# Patient Record
Sex: Female | Born: 1955 | Race: White | Hispanic: No | Marital: Single | Smoking: Never smoker
Health system: Southern US, Community
[De-identification: ages and names within clinical notes are randomized; demographics above are authoritative.]

## PROBLEM LIST (undated history)

## (undated) DIAGNOSIS — E669 Obesity, unspecified: Secondary | ICD-10-CM

## (undated) DIAGNOSIS — E039 Hypothyroidism, unspecified: Secondary | ICD-10-CM

## (undated) DIAGNOSIS — M1990 Unspecified osteoarthritis, unspecified site: Secondary | ICD-10-CM

## (undated) DIAGNOSIS — T8859XA Other complications of anesthesia, initial encounter: Secondary | ICD-10-CM

## (undated) DIAGNOSIS — M797 Fibromyalgia: Secondary | ICD-10-CM

## (undated) DIAGNOSIS — F3181 Bipolar II disorder: Secondary | ICD-10-CM

## (undated) DIAGNOSIS — G43909 Migraine, unspecified, not intractable, without status migrainosus: Secondary | ICD-10-CM

## (undated) DIAGNOSIS — J45909 Unspecified asthma, uncomplicated: Secondary | ICD-10-CM

## (undated) DIAGNOSIS — E079 Disorder of thyroid, unspecified: Secondary | ICD-10-CM

## (undated) DIAGNOSIS — I10 Essential (primary) hypertension: Secondary | ICD-10-CM

## (undated) DIAGNOSIS — S0300XA Dislocation of jaw, unspecified side, initial encounter: Secondary | ICD-10-CM

## (undated) DIAGNOSIS — T7840XA Allergy, unspecified, initial encounter: Secondary | ICD-10-CM

## (undated) DIAGNOSIS — R7303 Prediabetes: Secondary | ICD-10-CM

## (undated) HISTORY — DX: Prediabetes: R73.03

## (undated) HISTORY — PX: NOSE SURGERY: SHX723

## (undated) HISTORY — DX: Unspecified asthma, uncomplicated: J45.909

## (undated) HISTORY — DX: Disorder of thyroid, unspecified: E07.9

## (undated) HISTORY — PX: BUNIONECTOMY: SHX129

## (undated) HISTORY — DX: Essential (primary) hypertension: I10

## (undated) HISTORY — DX: Allergy, unspecified, initial encounter: T78.40XA

## (undated) HISTORY — PX: WISDOM TOOTH EXTRACTION: SHX21

## (undated) HISTORY — PX: TONSILLECTOMY: SUR1361

## (undated) HISTORY — PX: REPLACEMENT TOTAL KNEE: SUR1224

## (undated) HISTORY — PX: BACK SURGERY: SHX140

## (undated) HISTORY — DX: Bipolar II disorder: F31.81

---

## 2012-02-24 DIAGNOSIS — L239 Allergic contact dermatitis, unspecified cause: Secondary | ICD-10-CM | POA: Insufficient documentation

## 2016-07-15 DIAGNOSIS — F319 Bipolar disorder, unspecified: Secondary | ICD-10-CM | POA: Insufficient documentation

## 2018-08-12 DIAGNOSIS — I10 Essential (primary) hypertension: Secondary | ICD-10-CM | POA: Insufficient documentation

## 2018-08-12 DIAGNOSIS — E039 Hypothyroidism, unspecified: Secondary | ICD-10-CM | POA: Insufficient documentation

## 2018-08-12 DIAGNOSIS — M1991 Primary osteoarthritis, unspecified site: Secondary | ICD-10-CM | POA: Insufficient documentation

## 2019-07-06 DIAGNOSIS — F32A Depression, unspecified: Secondary | ICD-10-CM | POA: Insufficient documentation

## 2019-07-06 DIAGNOSIS — E66813 Obesity, class 3: Secondary | ICD-10-CM | POA: Insufficient documentation

## 2020-09-25 ENCOUNTER — Other Ambulatory Visit: Payer: Self-pay | Admitting: Gastroenterology

## 2020-09-25 ENCOUNTER — Other Ambulatory Visit: Payer: Self-pay | Admitting: Internal Medicine

## 2020-09-25 DIAGNOSIS — Z1231 Encounter for screening mammogram for malignant neoplasm of breast: Secondary | ICD-10-CM

## 2020-10-10 ENCOUNTER — Ambulatory Visit
Admission: RE | Admit: 2020-10-10 | Discharge: 2020-10-10 | Disposition: A | Discharge status: Home / Self Care | Payer: Self-pay | Source: Ambulatory Visit | Attending: Internal Medicine | Admitting: Internal Medicine

## 2020-10-10 ENCOUNTER — Other Ambulatory Visit: Payer: Self-pay

## 2020-10-10 ENCOUNTER — Encounter (INDEPENDENT_AMBULATORY_CARE_PROVIDER_SITE_OTHER): Payer: Self-pay

## 2020-10-10 DIAGNOSIS — Z1231 Encounter for screening mammogram for malignant neoplasm of breast: Secondary | ICD-10-CM | POA: Insufficient documentation

## 2020-10-10 LAB — HM HEPATITIS C SCREENING LAB: HM Hepatitis Screen: NEGATIVE

## 2020-10-10 LAB — HEMOGLOBIN A1C: Hemoglobin A1C: 5.8

## 2020-10-17 ENCOUNTER — Other Ambulatory Visit: Payer: Self-pay | Admitting: Internal Medicine

## 2020-10-17 DIAGNOSIS — R928 Other abnormal and inconclusive findings on diagnostic imaging of breast: Secondary | ICD-10-CM

## 2020-10-17 DIAGNOSIS — N6310 Unspecified lump in the right breast, unspecified quadrant: Secondary | ICD-10-CM

## 2020-11-12 ENCOUNTER — Ambulatory Visit: Payer: Self-pay | Attending: Internal Medicine

## 2020-11-20 ENCOUNTER — Other Ambulatory Visit: Payer: Self-pay

## 2020-11-20 ENCOUNTER — Ambulatory Visit
Admission: RE | Admit: 2020-11-20 | Discharge: 2020-11-20 | Disposition: A | Discharge status: Home / Self Care | Payer: BC Managed Care – PPO | Source: Ambulatory Visit | Attending: Internal Medicine | Admitting: Internal Medicine

## 2020-11-20 DIAGNOSIS — R928 Other abnormal and inconclusive findings on diagnostic imaging of breast: Secondary | ICD-10-CM | POA: Diagnosis present

## 2020-11-20 DIAGNOSIS — N6310 Unspecified lump in the right breast, unspecified quadrant: Secondary | ICD-10-CM | POA: Diagnosis present

## 2020-11-22 ENCOUNTER — Other Ambulatory Visit: Payer: Self-pay | Admitting: Internal Medicine

## 2020-11-22 DIAGNOSIS — N6310 Unspecified lump in the right breast, unspecified quadrant: Secondary | ICD-10-CM

## 2020-11-22 DIAGNOSIS — R928 Other abnormal and inconclusive findings on diagnostic imaging of breast: Secondary | ICD-10-CM

## 2020-11-26 ENCOUNTER — Ambulatory Visit
Admission: RE | Admit: 2020-11-26 | Discharge: 2020-11-26 | Disposition: A | Discharge status: Home / Self Care | Payer: BC Managed Care – PPO | Source: Ambulatory Visit | Attending: Internal Medicine | Admitting: Internal Medicine

## 2020-11-26 ENCOUNTER — Other Ambulatory Visit: Payer: Self-pay

## 2020-11-26 DIAGNOSIS — R928 Other abnormal and inconclusive findings on diagnostic imaging of breast: Secondary | ICD-10-CM

## 2020-11-26 DIAGNOSIS — N6310 Unspecified lump in the right breast, unspecified quadrant: Secondary | ICD-10-CM

## 2020-11-26 HISTORY — PX: BREAST BIOPSY: SHX20

## 2020-11-27 LAB — SURGICAL PATHOLOGY

## 2020-12-04 ENCOUNTER — Other Ambulatory Visit: Payer: Self-pay | Admitting: Internal Medicine

## 2020-12-04 DIAGNOSIS — R599 Enlarged lymph nodes, unspecified: Secondary | ICD-10-CM

## 2020-12-04 DIAGNOSIS — R928 Other abnormal and inconclusive findings on diagnostic imaging of breast: Secondary | ICD-10-CM

## 2021-01-07 ENCOUNTER — Ambulatory Visit
Admission: RE | Admit: 2021-01-07 | Discharge: 2021-01-07 | Disposition: A | Discharge status: Home / Self Care | Payer: BC Managed Care – PPO | Source: Ambulatory Visit | Attending: Internal Medicine | Admitting: Internal Medicine

## 2021-01-07 ENCOUNTER — Other Ambulatory Visit: Payer: Self-pay | Admitting: Internal Medicine

## 2021-01-07 ENCOUNTER — Other Ambulatory Visit: Payer: Self-pay

## 2021-01-07 DIAGNOSIS — R599 Enlarged lymph nodes, unspecified: Secondary | ICD-10-CM | POA: Diagnosis present

## 2021-01-07 DIAGNOSIS — R928 Other abnormal and inconclusive findings on diagnostic imaging of breast: Secondary | ICD-10-CM | POA: Insufficient documentation

## 2021-06-05 IMAGING — MG US  BREAST BX W/ LOC DEV 1ST LESION IMG BX SPEC US GUIDE*R*
1 series · 6 of 6 positions shown · non-contrast
Comparison: Previous exam(s).
COMPARISON: Previous exam(s).

Addendum:
CLINICAL DATA: 9 mm indeterminate mass in the 11 o'clock position
of the right breast at recent mammography and ultrasound. Multiple
recent vaccines including COVID booster, flu and shingles vaccines
over the past 3-7 weeks.

EXAM:
ULTRASOUND GUIDED RIGHT BREAST CORE NEEDLE BIOPSY

[Series 1: MG view · 0.06mm/px · 6 of 6 slices shown]
[im 1/6]
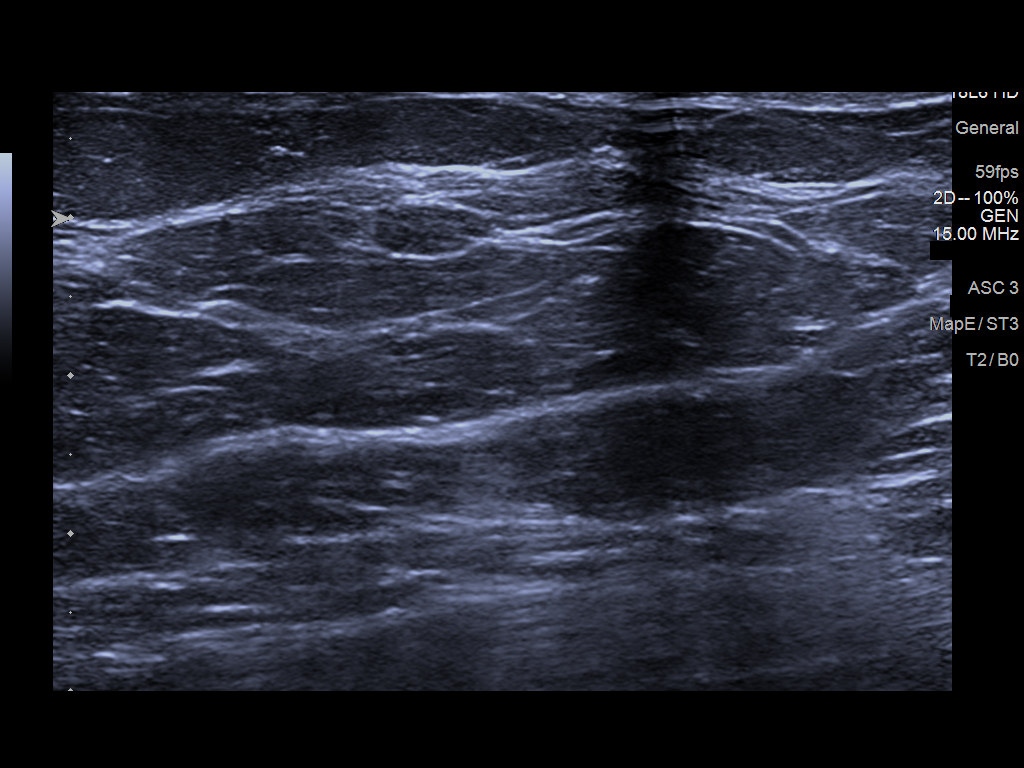
[im 2/6]
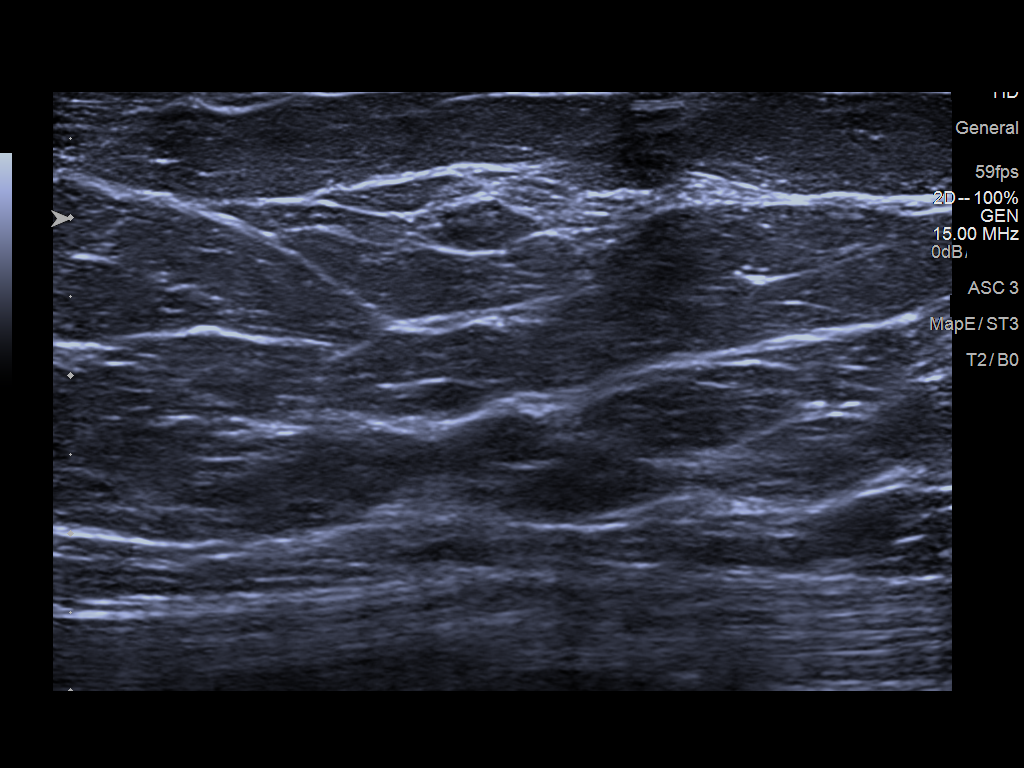
[im 3/6]
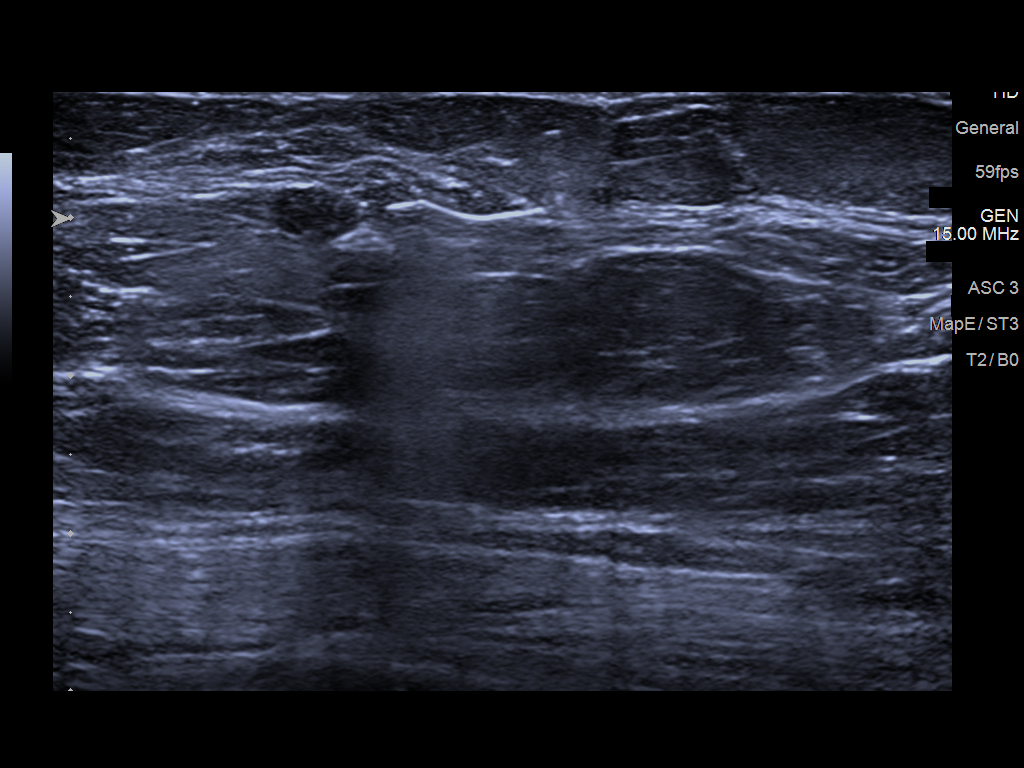
[im 4/6]
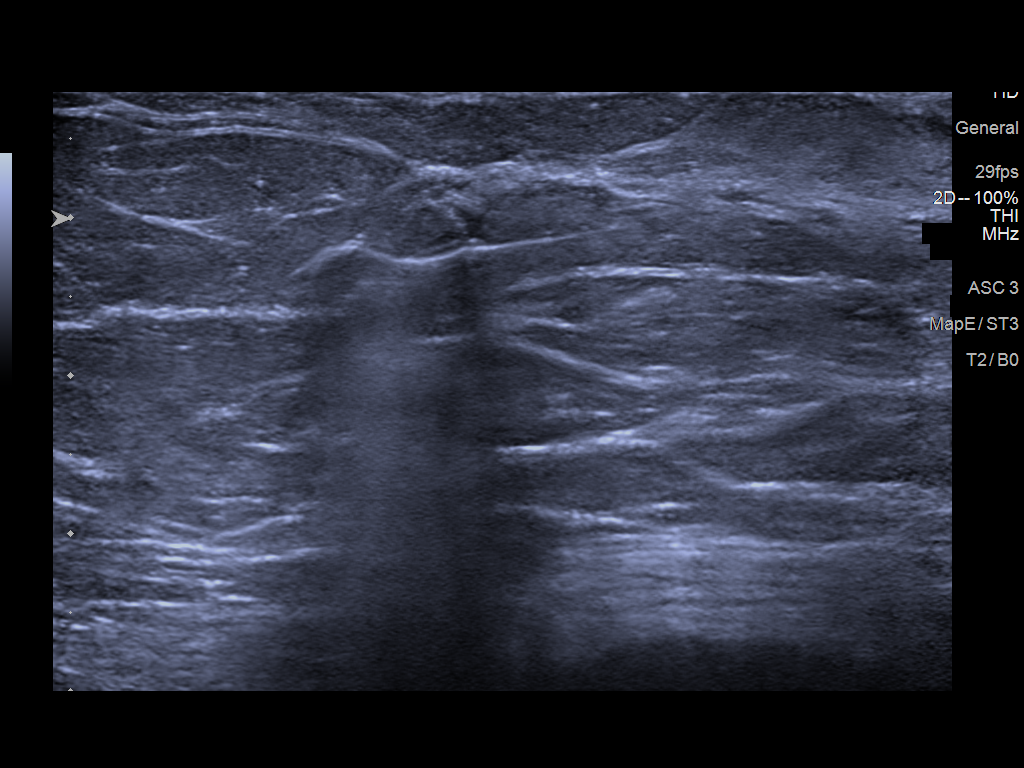
[im 5/6]
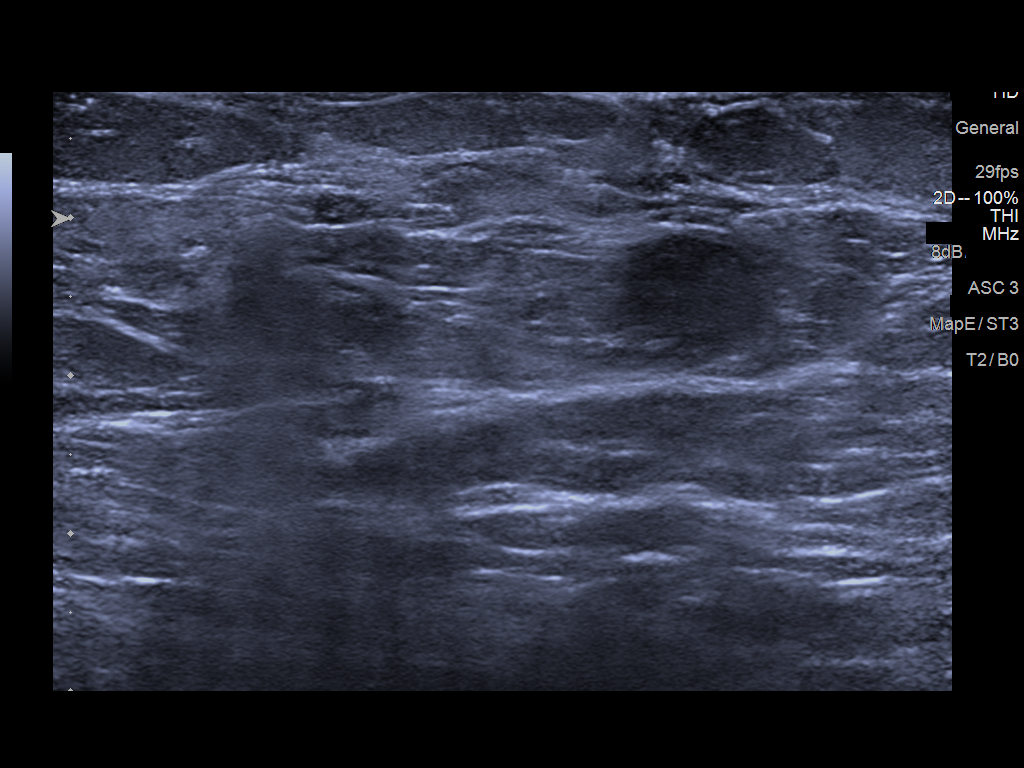
[im 6/6]
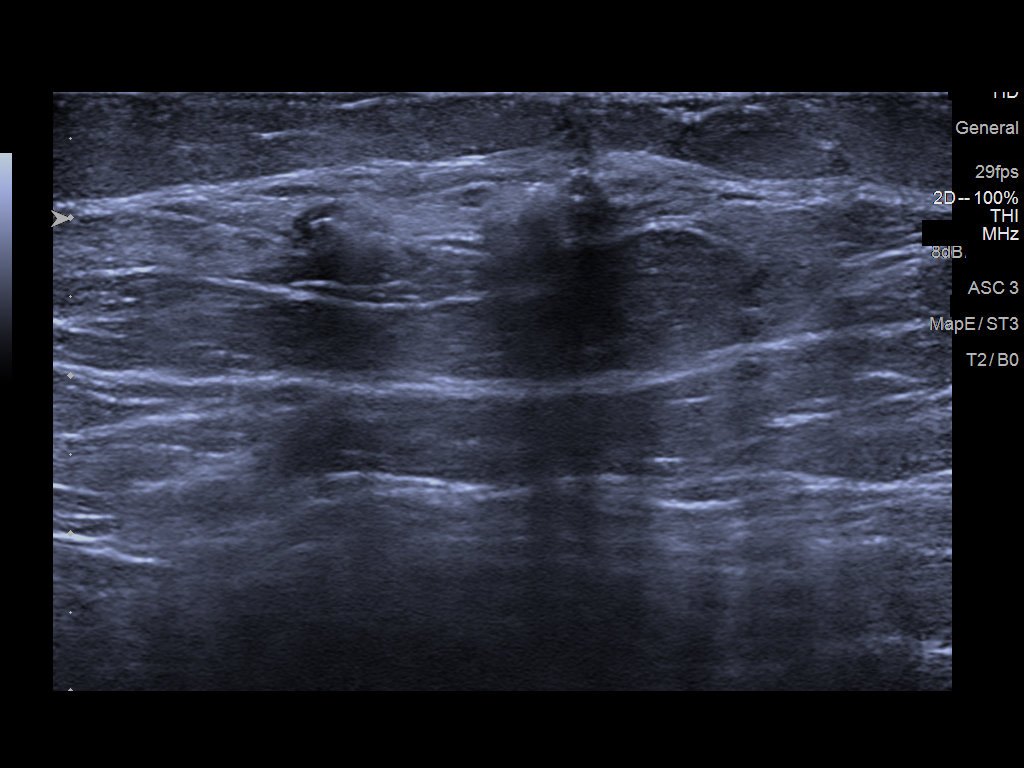

[6 of 6 positions shown; findings below may reference images not displayed]



Lesion quadrant: Upper outer quadrant

Using sterile technique and 1% Lidocaine as local anesthetic, under
direct ultrasound visualization, a 12 gauge Meng device was
used to perform biopsy of the recently demonstrated 9 mm mass in the
11 o'clock position of the right breast, 3 cm from the nipple, using
an inferolateral approach. At the conclusion of the procedure a
Vision tissue marker clip was deployed into the biopsy cavity.
Follow up 2 view mammogram was performed and dictated separately.
IMPRESSION: Ultrasound guided biopsy of the recently demonstrated 9 mm mass in
the 11 o'clock position of the right breast. No apparent
complications.

ADDENDUM:
PATHOLOGY revealed: A. BREAST, RIGHT [DATE] 3 CM FN;
ULTRASOUND-GUIDED BIOPSY: - BENIGN BREAST TISSUE WITH
FIBROADENOMATOID CHANGE. - NEGATIVE FOR ATYPIA AND MALIGNANCY.

Pathology results are CONCORDANT with imaging findings, per Dr.
Sherif Lau.

Pathology results and recommendations below were discussed with
patient by telephone on 11/27/2020. Patient reported biopsy site
within normal limits with slight tenderness at the site. Post biopsy
care instructions were reviewed, questions were answered and my
direct phone number was provided to patient. Patient was instructed
to call [HOSPITAL] if any concerns or questions arise
related to the biopsy.

Recommendation: Patient instructed to return in five to seven weeks
for RIGHT axillary ultrasound per recent ultrasound report. Patient
will be contacted by [HOSPITAL] for appointment date and
time of follow-up ultrasound.

Pathology results reported by Mekuwanint Neber Arid Amhara RN on 11/28/2020.



Lesion quadrant: Upper outer quadrant

Using sterile technique and 1% Lidocaine as local anesthetic, under
direct ultrasound visualization, a 12 gauge Meng device was
used to perform biopsy of the recently demonstrated 9 mm mass in the
11 o'clock position of the right breast, 3 cm from the nipple, using
an inferolateral approach. At the conclusion of the procedure a
Vision tissue marker clip was deployed into the biopsy cavity.
Follow up 2 view mammogram was performed and dictated separately.
IMPRESSION: Ultrasound guided biopsy of the recently demonstrated 9 mm mass in
the 11 o'clock position of the right breast. No apparent
complications.

## 2021-06-05 IMAGING — MG MM BREAST LOCALIZATION CLIP
4 series · 4 of 12 positions shown · non-contrast
Comparison: Previous exam(s).

CLINICAL DATA: Status post ultrasound-guided core needle biopsy of
a recently demonstrated 9 mm mass in the 11 o'clock position of the
right breast.

EXAM:
DIAGNOSTIC RIGHT MAMMOGRAM POST ULTRASOUND BIOPSY

[R ML synth-2D]
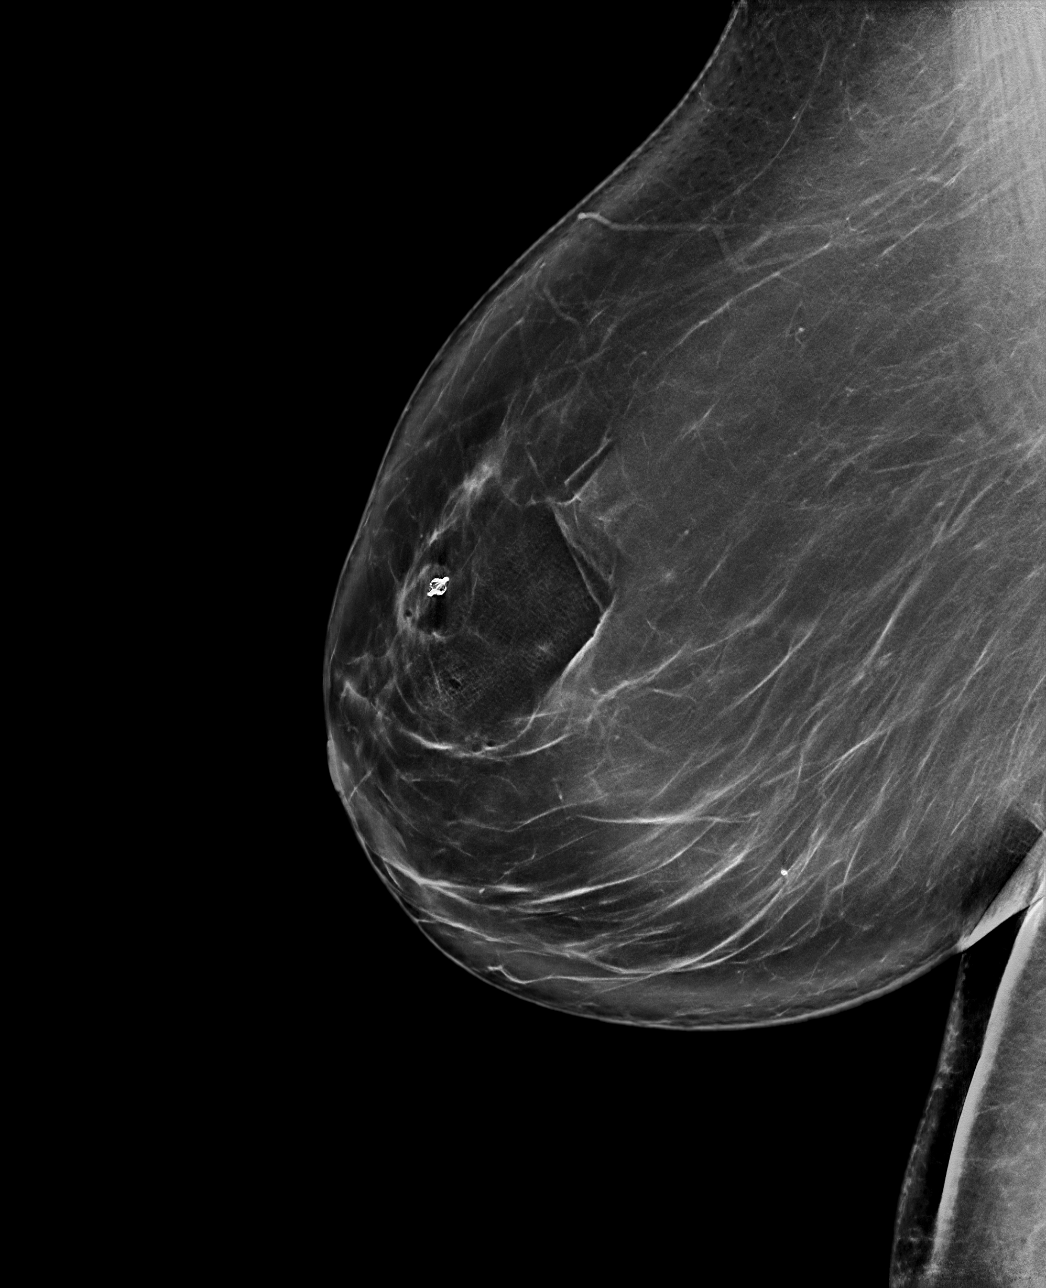

[R CC synth-2D]
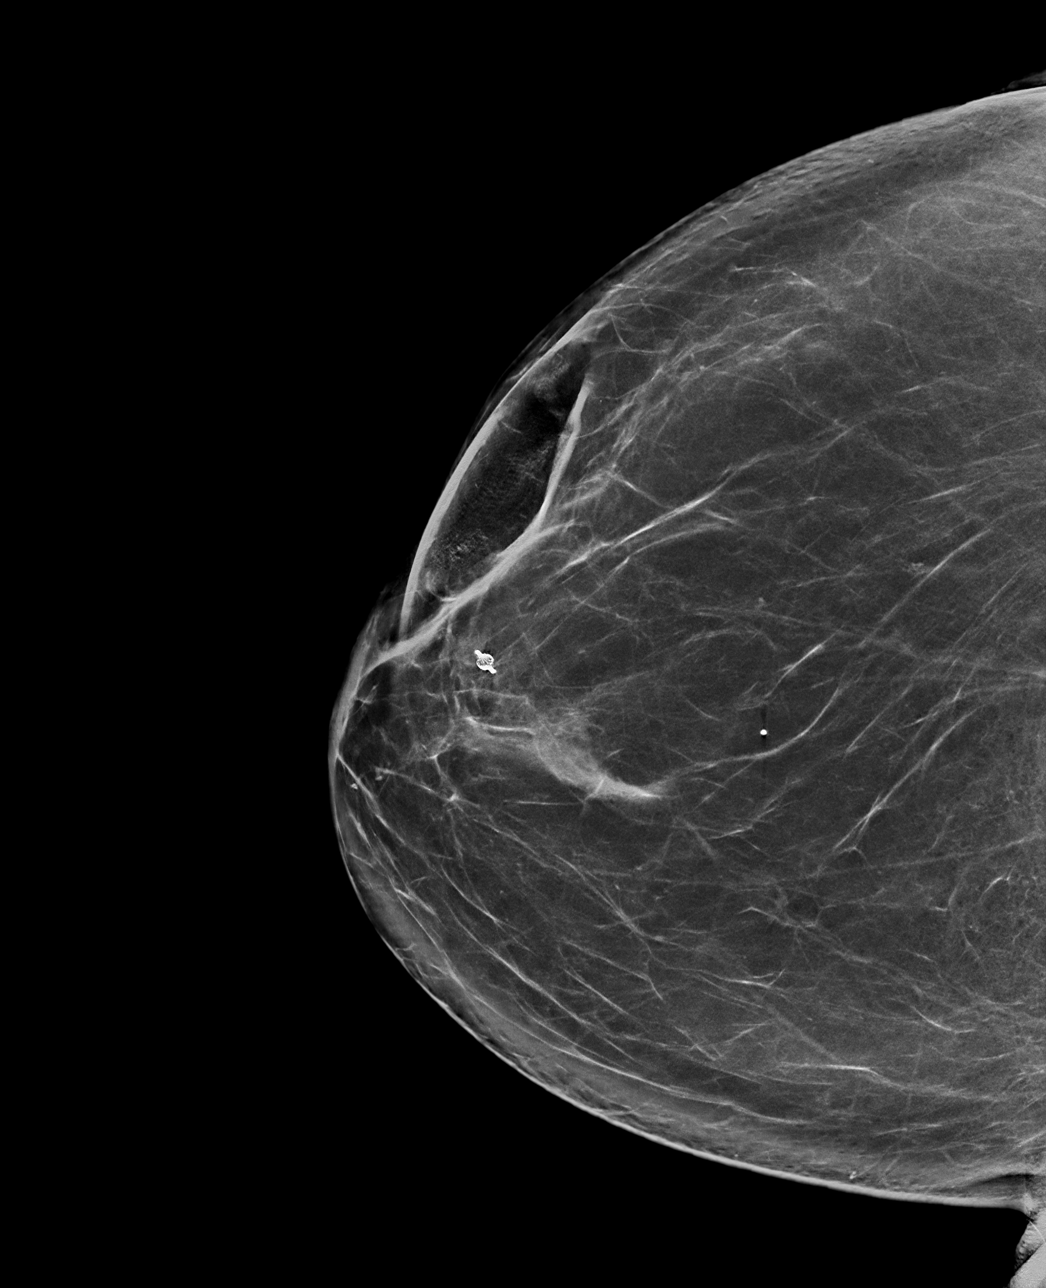

[R CC tomo · tomo slice 45/88.0]
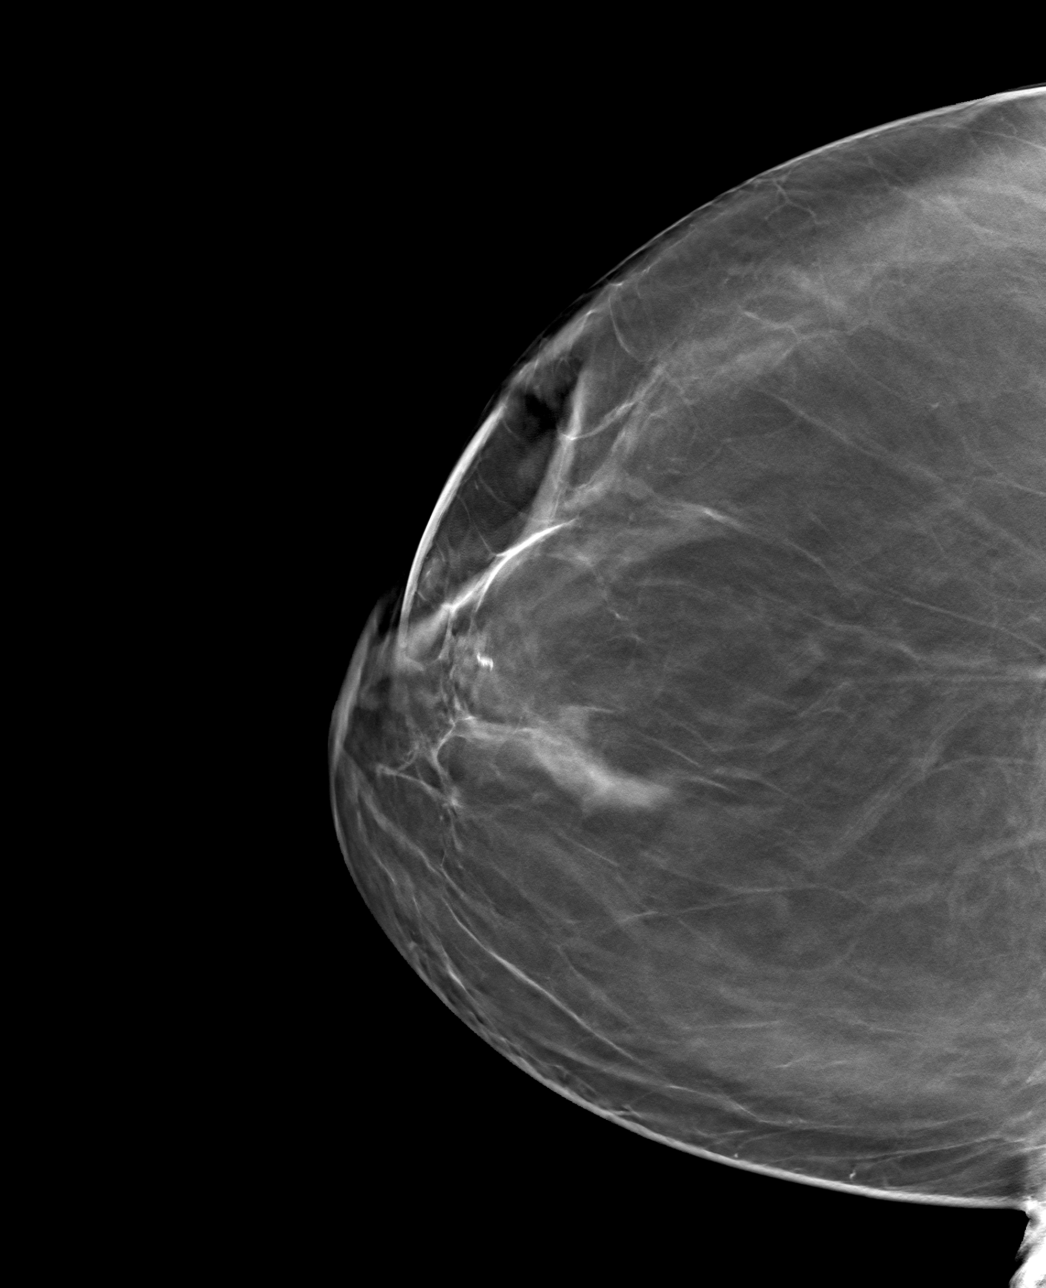

[R ML tomo · tomo slice 50/99.0]
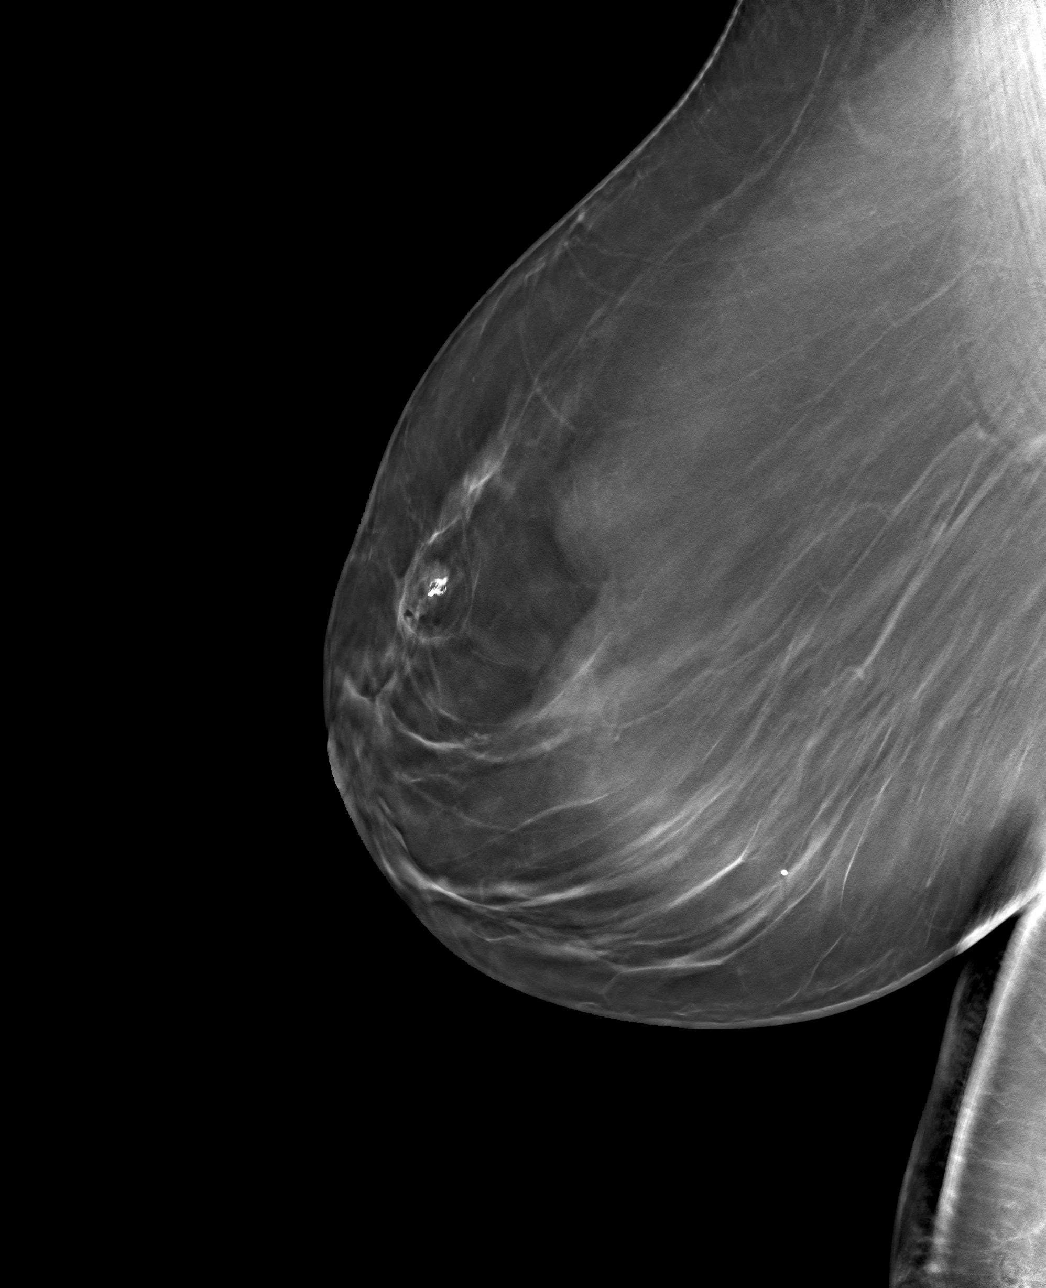

[4 of 12 positions shown; findings below may reference images not displayed]

FINDINGS: Mammographic images were obtained following ultrasound guided biopsy
of the recently demonstrated 9 mm mass in the 11 o'clock position of
the right breast. The biopsy marking clip is in expected position at
the site of biopsy.
IMPRESSION: Appropriate positioning of the Vision biopsy marking clip at the
site of biopsy in the anterior aspect of the 11 o'clock position of
the right breast at the location of the recently demonstrated mass.

Final Assessment: Post Procedure Mammograms for Marker Placement

## 2021-08-13 ENCOUNTER — Ambulatory Visit: Payer: BC Managed Care – PPO | Admitting: Nurse Practitioner

## 2021-09-25 NOTE — Progress Notes (Signed)
New Patient Office Visit  Subjective:  Patient ID: Shelby Johns, female    DOB: 18-Jul-1956  Age: 65 y.o. MRN: [id number]  CC:  Chief Complaint  Patient presents with   Establish Care   Dermatitis    HPI Shelby Johns presents for new patient visit to establish care.  Introduced to Publishing rights manager role and practice setting.  All questions answered.  Discussed provider/patient relationship and expectations.  MIGRAINES  Duration: chronic - had about 20 years ago and now they are starting to come back again Onset: gradual Severity: 2/10 Quality: aching Frequency: intermittent Location: forehead to neck Headache duration: varies Radiation: no Time of day headache occurs: mornings Alleviating factors: unsure Aggravating factors: not wearing glasses, TV Headache status at time of visit: current headache Treatments attempted: Treatments attempted: rest and APAP   Aura: no Nausea:  yes - at times Vomiting: yes - at times Photophobia:  yes Phonophobia:  yes Effect on social functioning:  no Numbers of missed days of school/work each month: none Confusion:  no Gait disturbance/ataxia:  no Behavioral changes:  no Fevers:  no  HYPERTENSION  Hypertension status: controlled  Satisfied with current treatment? yes Duration of hypertension: chronic BP monitoring frequency:  not checking BP range: n/a BP medication side effects:  no Medication compliance: excellent compliance Previous BP meds:losartan (cozaar) Aspirin: no Recurrent headaches: yes - migraine Visual changes: no Palpitations: no Dyspnea: no Chest pain: no Lower extremity edema: yes - intermittent Dizzy/lightheaded: no  BIPOLAR  Saw pscyhiatrist for several years when first diagnosed. Started lamictal 5 years ago. Symptoms are currently well controlled.   Depression screen [facility] 2/9 09/26/2021  Decreased Interest 0  Down, Depressed, Hopeless 0  PHQ - 2 Score 0  Altered sleeping 1  Tired, decreased  energy 1  Change in appetite 0  Feeling bad or failure about yourself  0  Trouble concentrating 1  Moving slowly or fidgety/restless 0  Suicidal thoughts 0  PHQ-9 Score 3  Difficult doing work/chores Not difficult at all   GAD 7 : Generalized Anxiety Score 09/26/2021  Nervous, Anxious, on Edge 0  Control/stop worrying 0  Worry too much - different things 0  Trouble relaxing 0  Restless 0  Easily annoyed or irritable 0  Afraid - awful might happen 0  Total GAD 7 Score 0  Anxiety Difficulty Not difficult at all    Past Medical History:  Diagnosis Date   Allergies    Bipolar 2 disorder (HCC)    Hypertension    Prediabetes    Thyroid disease     Past Surgical History:  Procedure Laterality Date   BACK SURGERY     BREAST BIOPSY Right 11/26/2020   [place] bx, vision marker, path pending   BUNIONECTOMY Left    NOSE SURGERY     REPLACEMENT TOTAL KNEE Right    TONSILLECTOMY     age 24   WISDOM TOOTH EXTRACTION      Family History  Problem Relation Age of Onset   Hypertension Mother    Colon cancer Mother    Alzheimer's disease Mother    Diabetes Father    Hypertension Father    Arrhythmia Father    Cancer Brother    Schizophrenia Maternal Grandmother    Heart failure Maternal Grandfather    Breast cancer Neg Hx     Social History   Socioeconomic History   Marital status: Divorced    Spouse name: Not on file   Number of children: Not  on file   Years of education: Not on file   Highest education level: Not on file  Occupational History   Not on file  Tobacco Use   Smoking status: Never   Smokeless tobacco: Never  Vaping Use   Vaping Use: Never used  Substance and Sexual Activity   Alcohol use: Yes    Alcohol/week: 2.0 standard drinks    Types: 2 Glasses of wine per week   Drug use: Never   Sexual activity: Not Currently    Partners: Male  Other Topics Concern   Not on file  Social History Narrative   Not on file   Social Determinants of Health    Financial Resource Strain: Not on file  Food Insecurity: Not on file  Transportation Needs: Not on file  Physical Activity: Not on file  Stress: Not on file  Social Connections: Not on file  Intimate Partner Violence: Not on file    ROS Review of Systems  Constitutional:  Positive for fatigue.  HENT:  Positive for congestion.   Eyes: Negative.   Respiratory:  Positive for cough. Negative for shortness of breath.   Cardiovascular: Negative.   Gastrointestinal: Negative.   Endocrine: Positive for cold intolerance. Negative for polydipsia, polyphagia and polyuria.  Genitourinary: Negative.   Musculoskeletal:  Positive for arthralgias (chronic left knee pain).  Skin:  Positive for rash (dermatitis due to clothes allergy).  Allergic/Immunologic: Positive for environmental allergies.  Neurological:  Positive for headaches. Negative for dizziness.  Psychiatric/Behavioral: Negative.     Objective:   Today's Vitals: BP 126/84   Pulse 80   Ht 5\' 2"  (1.575 m)   Wt 209 lb (94.8 kg)   BMI 38.23 kg/m   Physical Exam Vitals and nursing note reviewed.  Constitutional:      General: She is not in acute distress.    Appearance: Normal appearance.  HENT:     Head: Normocephalic and atraumatic.     Right Ear: Tympanic membrane, ear canal and external ear normal.     Left Ear: Tympanic membrane, ear canal and external ear normal.     Nose: Nose normal.     Mouth/Throat:     Mouth: Mucous membranes are moist.     Pharynx: Oropharynx is clear.  Eyes:     Conjunctiva/sclera: Conjunctivae normal.  Cardiovascular:     Rate and Rhythm: Normal rate and regular rhythm.     Pulses: Normal pulses.     Heart sounds: Normal heart sounds.  Pulmonary:     Effort: Pulmonary effort is normal.     Breath sounds: Normal breath sounds.  Abdominal:     General: Bowel sounds are normal.     Palpations: Abdomen is soft.     Tenderness: There is no abdominal tenderness.  Musculoskeletal:         General: Normal range of motion.     Cervical back: Normal range of motion.  Skin:    General: Skin is warm and dry.  Neurological:     General: No focal deficit present.     Mental Status: She is alert and oriented to person, place, and time.  Psychiatric:        Mood and Affect: Mood normal.        Behavior: Behavior normal.        Thought Content: Thought content normal.        Judgment: Judgment normal.    Assessment & Plan:   Problem List Items Addressed This Visit  Cardiovascular and Mediastinum   HTN, goal below 140/90 - Primary    Chronic, stable. Blood pressure well controlled, continue current regimen. Refills sent to the pharmacy. Check CMP, CBC today. Follow-up in 6 months.       Relevant Medications   furosemide (LASIX) 20 MG tablet   losartan (COZAAR) 25 MG tablet   Other Relevant Orders   CBC with Differential/Platelet   Comprehensive metabolic panel     Endocrine   Hypothyroidism, acquired    Chronic. Will check TSH today and adjust regimen as needed. Refill sent to the pharmacy.       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 She has seen an allergist, who diagnosed this condition. Will place referral to dermatology.         Other   Bipolar disorder (HCC)    Chronic, stable on lamictal 100mg  daily. Will continue current regimen. Check lamictal level and refills sent to the pharmacy. Follow-up in 6 months.       Relevant Orders   Lamotrigine level   Arthralgia    Chronic, ongoing. Pain to fingers and bilateral knees. She has been avoiding NSAIDs due to elevated kidney function. Will check ANA and RF today. Consider referral to rheumatology in the future.       Relevant Orders   Antinuclear Antib (ANA)   Rheumatoid  Factor   Anti-CCP Ab, IgG + IgA (RDL)   Prediabetes    A1C today is 5.0%. Controlled with diet and lifestyle modifications. She has lost 20 pounds since the summer, congratulated her on this. Continue limiting the amounts of added sugars and carbohydrates.       Relevant Orders   Bayer DCA Hb A1c Waived   Obesity (BMI 30-39.9)    BMI 38.2. She has lost 20 pounds since the beginning of summer. Congratulated her on this. Goal is to lose 1-2 pounds per week.       Other Visit Diagnoses     Encounter for lipid screening for cardiovascular disease       Lipid panel today   Relevant Orders   Lipid Panel w/o Chol/HDL Ratio   Screening for HIV (human immunodeficiency virus)       Screen for HIV today   Relevant Orders   HIV Antibody (routine testing w rflx)   Need for immunization against influenza       Flu vaccine given today   Relevant Orders   Flu Vaccine QUAD 15mo+IM (Fluarix, Fluzone & Alfiuria Quad PF) (Completed)   Encounter to establish care       Changing providers from [place], [place] to closer to home       Outpatient Encounter Medications as of 09/26/2021  Medication Sig   acetaminophen (TYLENOL) 500 MG tablet Take 1,000 mg by mouth every 6 (six) hours as needed.   [DISCONTINUED] lamoTRIgine (LAMICTAL) 100 MG tablet Take by mouth.   [DISCONTINUED] losartan (COZAAR) 25 MG tablet Take by mouth.   Cetirizine HCl 10 MG CAPS Take by mouth.   furosemide (LASIX) 20 MG tablet Take 1 tablet (20 mg total) by mouth every 3 (three) days.   lamoTRIgine (LAMICTAL) 100 MG tablet Take 1 tablet (100 mg total) by mouth daily.   levothyroxine (SYNTHROID) 75 MCG tablet  Take 1 tablet (75 mcg total) by mouth daily.   losartan (COZAAR) 25 MG tablet Take 1 tablet (25 mg total) by mouth daily.   montelukast (SINGULAIR) 10 MG tablet Take 1 tablet (10 mg total) by mouth daily.   [DISCONTINUED] furosemide (LASIX) 20 MG tablet Take 20 mg by mouth every 3 (three) days.   [DISCONTINUED] levothyroxine  (SYNTHROID) 75 MCG tablet Take 75 mcg by mouth daily.   [DISCONTINUED] montelukast (SINGULAIR) 10 MG tablet Take 10 mg by mouth daily.   No facility-administered encounter medications on file as of 09/26/2021.    Follow-up: Return in about 6 months (around 03/27/2022) for physical .   Gerre Scull, NP

## 2021-09-26 ENCOUNTER — Ambulatory Visit (INDEPENDENT_AMBULATORY_CARE_PROVIDER_SITE_OTHER): Payer: 59 | Admitting: Nurse Practitioner

## 2021-09-26 ENCOUNTER — Encounter: Payer: Self-pay | Admitting: Nurse Practitioner

## 2021-09-26 ENCOUNTER — Other Ambulatory Visit: Payer: Self-pay

## 2021-09-26 VITALS — BP 126/84 | HR 80 | Ht 62.0 in | Wt 209.0 lb

## 2021-09-26 DIAGNOSIS — R7303 Prediabetes: Secondary | ICD-10-CM

## 2021-09-26 DIAGNOSIS — E039 Hypothyroidism, unspecified: Secondary | ICD-10-CM

## 2021-09-26 DIAGNOSIS — F319 Bipolar disorder, unspecified: Secondary | ICD-10-CM | POA: Diagnosis not present

## 2021-09-26 DIAGNOSIS — Z23 Encounter for immunization: Secondary | ICD-10-CM

## 2021-09-26 DIAGNOSIS — Z13220 Encounter for screening for lipoid disorders: Secondary | ICD-10-CM

## 2021-09-26 DIAGNOSIS — M2550 Pain in unspecified joint: Secondary | ICD-10-CM

## 2021-09-26 DIAGNOSIS — Z7689 Persons encountering health services in other specified circumstances: Secondary | ICD-10-CM

## 2021-09-26 DIAGNOSIS — Z114 Encounter for screening for human immunodeficiency virus [HIV]: Secondary | ICD-10-CM

## 2021-09-26 DIAGNOSIS — I10 Essential (primary) hypertension: Secondary | ICD-10-CM | POA: Diagnosis not present

## 2021-09-26 DIAGNOSIS — Z136 Encounter for screening for cardiovascular disorders: Secondary | ICD-10-CM

## 2021-09-26 DIAGNOSIS — E669 Obesity, unspecified: Secondary | ICD-10-CM

## 2021-09-26 DIAGNOSIS — L2389 Allergic contact dermatitis due to other agents: Secondary | ICD-10-CM | POA: Diagnosis not present

## 2021-09-26 LAB — BAYER DCA HB A1C WAIVED: HB A1C (BAYER DCA - WAIVED): 5 % (ref 4.8–5.6)

## 2021-09-26 MED ORDER — MONTELUKAST SODIUM 10 MG PO TABS: 10.0000 mg | ORAL_TABLET | Freq: Every day | ORAL | 1 refills | Status: DC

## 2021-09-26 MED ORDER — LAMOTRIGINE 100 MG PO TABS
100.0000 mg | ORAL_TABLET | Freq: Every day | ORAL | 1 refills | Status: DC
Start: 2021-09-26 — End: 2021-12-29

## 2021-09-26 MED ORDER — FUROSEMIDE 20 MG PO TABS: 20.0000 mg | ORAL_TABLET | ORAL | 1 refills | Status: DC

## 2021-09-26 MED ORDER — LOSARTAN POTASSIUM 25 MG PO TABS: 25.0000 mg | ORAL_TABLET | Freq: Every day | ORAL | 1 refills | Status: DC

## 2021-09-26 MED ORDER — LEVOTHYROXINE SODIUM 75 MCG PO TABS: 75.0000 ug | ORAL_TABLET | Freq: Every day | ORAL | 1 refills | Status: DC

## 2021-09-26 NOTE — Assessment & Plan Note (Addendum)
Chronic, stable. Blood pressure well controlled, continue current regimen. Refills sent to the pharmacy. Check CMP, CBC today. Follow-up in 6 months.

## 2021-09-26 NOTE — Assessment & Plan Note (Signed)
A1C today is 5.0%. Controlled with diet and lifestyle modifications. She has lost 20 pounds since the summer, congratulated her on this. Continue limiting the amounts of added sugars and carbohydrates.

## 2021-09-26 NOTE — Patient Instructions (Addendum)
Start zyrtec (cetirizine) daily

## 2021-09-26 NOTE — Assessment & Plan Note (Signed)
Chronic, ongoing. Pain to fingers and bilateral knees. She has been avoiding NSAIDs due to elevated kidney function. Will check ANA and RF today. Consider referral to rheumatology in the future.

## 2021-09-26 NOTE — Assessment & Plan Note (Signed)
BMI 38.2. She has lost 20 pounds since the beginning of summer. Congratulated her on this. Goal is to lose 1-2 pounds per week.

## 2021-09-26 NOTE — Addendum Note (Signed)
Addended by: Rodman Pickle A on: 09/26/2021 01:20 PM   Modules accepted: Orders

## 2021-09-26 NOTE — Assessment & Plan Note (Signed)
Chronic. Will check TSH today and adjust regimen as needed. Refill sent to the pharmacy.

## 2021-09-26 NOTE — Assessment & Plan Note (Signed)
Chronic, stable on lamictal 100mg  daily. Will continue current regimen. Check lamictal level and refills sent to the pharmacy. Follow-up in 6 months.

## 2021-09-26 NOTE — Assessment & Plan Note (Signed)
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 She has seen an allergist, who diagnosed this condition. Will place referral to dermatology.

## 2021-10-02 LAB — CBC WITH DIFFERENTIAL/PLATELET
Basophils Absolute: 0.1 10*3/uL (ref 0.0–0.2)
Basos: 1 %
EOS (ABSOLUTE): 0.2 10*3/uL (ref 0.0–0.4)
Eos: 4 %
Hematocrit: 37.8 % (ref 34.0–46.6)
Hemoglobin: 12.4 g/dL (ref 11.1–15.9)
Immature Grans (Abs): 0 10*3/uL (ref 0.0–0.1)
Immature Granulocytes: 0 %
Lymphocytes Absolute: 2.5 10*3/uL (ref 0.7–3.1)
Lymphs: 43 %
MCH: 28.7 pg (ref 26.6–33.0)
MCHC: 32.8 g/dL (ref 31.5–35.7)
MCV: 88 fL (ref 79–97)
Monocytes Absolute: 0.3 10*3/uL (ref 0.1–0.9)
Monocytes: 4 %
Neutrophils Absolute: 2.8 10*3/uL (ref 1.4–7.0)
Neutrophils: 48 %
Platelets: 275 10*3/uL (ref 150–450)
RBC: 4.32 x10E6/uL (ref 3.77–5.28)
RDW: 13.4 % (ref 11.7–15.4)
WBC: 5.9 10*3/uL (ref 3.4–10.8)

## 2021-10-02 LAB — COMPREHENSIVE METABOLIC PANEL
ALT: 13 IU/L (ref 0–32)
AST: 25 IU/L (ref 0–40)
Albumin/Globulin Ratio: 2 (ref 1.2–2.2)
Albumin: 4.5 g/dL (ref 3.8–4.8)
Alkaline Phosphatase: 100 IU/L (ref 44–121)
BUN/Creatinine Ratio: 14 (ref 12–28)
BUN: 12 mg/dL (ref 8–27)
Bilirubin Total: 0.4 mg/dL (ref 0.0–1.2)
CO2: 22 mmol/L (ref 20–29)
Calcium: 8.9 mg/dL (ref 8.7–10.3)
Chloride: 105 mmol/L (ref 96–106)
Creatinine, Ser: 0.85 mg/dL (ref 0.57–1.00)
Globulin, Total: 2.3 g/dL (ref 1.5–4.5)
Glucose: 83 mg/dL (ref 70–99)
Potassium: 4.3 mmol/L (ref 3.5–5.2)
Sodium: 141 mmol/L (ref 134–144)
Total Protein: 6.8 g/dL (ref 6.0–8.5)
eGFR: 76 mL/min/{1.73_m2} (ref >59)

## 2021-10-02 LAB — LIPID PANEL W/O CHOL/HDL RATIO
Cholesterol, Total: 196 mg/dL (ref 100–199)
HDL: 62 mg/dL (ref >39)
LDL Chol Calc (NIH): 114 mg/dL — ABNORMAL HIGH (ref 0–99)
Triglycerides: 115 mg/dL (ref 0–149)
VLDL Cholesterol Cal: 20 mg/dL (ref 5–40)

## 2021-10-02 LAB — HIV ANTIBODY (ROUTINE TESTING W REFLEX): HIV Screen 4th Generation wRfx: NONREACTIVE

## 2021-10-02 LAB — ANTI-CCP AB, IGG + IGA (RDL): Anti-CCP Ab, IgG + IgA (RDL): <20 Units (ref <20)

## 2021-10-02 LAB — RHEUMATOID FACTOR: Rheumatoid fact SerPl-aCnc: <10 IU/mL (ref <14.0)

## 2021-10-02 LAB — LAMOTRIGINE LEVEL: Lamotrigine Lvl: 2.4 ug/mL (ref 2.0–20.0)

## 2021-10-02 LAB — TSH: TSH: 3.71 u[IU]/mL (ref 0.450–4.500)

## 2021-10-02 LAB — ANA: Anti Nuclear Antibody (ANA): NEGATIVE

## 2021-10-22 ENCOUNTER — Other Ambulatory Visit: Payer: Self-pay | Admitting: Nurse Practitioner

## 2021-10-22 NOTE — Telephone Encounter (Signed)
Rx written 09/26/2021 #45 with 1 refill. Pt is to take 1 every 3 days. Pt should have ample supply.

## 2021-10-22 NOTE — Telephone Encounter (Signed)
Just an fyi

## 2021-10-22 NOTE — Telephone Encounter (Signed)
Requested Prescriptions  Pending Prescriptions Disp Refills  . furosemide (LASIX) 20 MG tablet [Pharmacy Med Name: FUROSEMIDE 20MG  TABLETS] 45 tablet 1    Sig: TAKE 1 TABLET BY MOUTH EVERY 3 DAYS     Cardiovascular:  Diuretics - Loop Passed - 10/22/2021  3:17 AM      Passed - K in normal range and within 360 days    Potassium  Date Value Ref Range Status  09/26/2021 4.3 3.5 - 5.2 mmol/L Final         Passed - Ca in normal range and within 360 days    Calcium  Date Value Ref Range Status  09/26/2021 8.9 8.7 - 10.3 mg/dL Final         Passed - Na in normal range and within 360 days    Sodium  Date Value Ref Range Status  09/26/2021 141 134 - 144 mmol/L Final         Passed - Cr in normal range and within 360 days    Creatinine, Ser  Date Value Ref Range Status  09/26/2021 0.85 0.57 - 1.00 mg/dL Final         Passed - Last BP in normal range    BP Readings from Last 1 Encounters:  09/26/21 126/84         Passed - Valid encounter within last 6 months    Recent Outpatient Visits          3 weeks ago HTN, goal below 140/90   [facility], [facility], NP      Future Appointments            In 5 months Jake Church, NP [facility], [facility]

## 2021-12-22 ENCOUNTER — Other Ambulatory Visit: Payer: Self-pay | Admitting: Family Medicine

## 2021-12-22 NOTE — Telephone Encounter (Signed)
Requested medication (s) are due for refill today  No   Last ordered 09/26/21  #90 tabs with one refill.  Requested medication (s) are on the active medication list   Yes  Future visit scheduled Yes for 03/31/22.  LOV 09/26/21  Note to clinic-Patient has other provider at other location approving recent refills. Routing to clinic for review due to uncertainty if patient is continuing care at [facility] with our providers.    Requested Prescriptions  Pending Prescriptions Disp Refills   montelukast (SINGULAIR) 10 MG tablet [Pharmacy Med Name: MONTELUKAST 10MG  TABLETS] 30 tablet     Sig: TAKE 1 TABLET(10 MG) BY MOUTH EVERY DAY     Pulmonology:  Leukotriene Inhibitors Passed - 12/22/2021  3:03 PM      Passed - Valid encounter within last 12 months    Recent Outpatient Visits           2 months ago HTN, goal below 140/90   [facility], [facility], NP       Future Appointments             In 3 months Jake Church, NP [facility], [facility]

## 2021-12-22 NOTE — Telephone Encounter (Signed)
Last refill sent 09/26/21 #90 with one refill, therefore refusing request.  Requested Prescriptions  Pending Prescriptions Disp Refills   levothyroxine (SYNTHROID) 75 MCG tablet [Pharmacy Med Name: LEVOTHYROXINE 0.075MG  ( ) TABS] 30 tablet     Sig: TAKE 1 TABLET BY MOUTH EVERY MORNING BEFORE [id number]) ON AN EMPTY STOMACH WITH A GLASS OF WATER AT LEAST 30 TO 60 MINUTES BEFORE BREAKFAST     Endocrinology:  Hypothyroid Agents Failed - 12/22/2021  3:03 PM      Failed - TSH needs to be rechecked within 3 months after an abnormal result. Refill until TSH is due.      Passed - TSH in normal range and within 360 days    TSH  Date Value Ref Range Status  09/26/2021 3.710 0.450 - 4.500 uIU/mL Final         Passed - Valid encounter within last 12 months    Recent Outpatient Visits          2 months ago HTN, goal below 140/90   [facility], Jake Church, NP      Future Appointments            In 3 months Larae Grooms, NP [facility], [facility]

## 2021-12-26 ENCOUNTER — Ambulatory Visit: Payer: Self-pay | Admitting: *Deleted

## 2021-12-26 NOTE — Telephone Encounter (Signed)
Reason for Disposition  [1] MODERATE dizziness (e.g., interferes with normal activities) AND [2] has NOT been evaluated by physician for this  (Exception: dizziness caused by heat exposure, sudden standing, or poor fluid intake)  Answer Assessment - Initial Assessment Questions 1. DESCRIPTION: "Describe your dizziness."     Extended activity causes weakness and dizziness- gets flushed 2. LIGHTHEADED: "Do you feel lightheaded?" (e.g., somewhat faint, woozy, weak upon standing)     Weak and faint- 3-4 times/day at work 3. VERTIGO: "Do you feel like either you or the room is spinning or tilting?" (i.e. vertigo)     no 4. SEVERITY: "How bad is it?"  "Do you feel like you are going to faint?" "Can you stand and walk?"   - MILD: Feels slightly dizzy, but walking normally.   - MODERATE: Feels unsteady when walking, but not falling; interferes with normal activities (e.g., school, work).   - SEVERE: Unable to walk without falling, or requires assistance to walk without falling; feels like passing out now.      Mild- patient does sit down  5. ONSET:  "When did the dizziness begin?"     2 weeks 6. AGGRAVATING FACTORS: "Does anything make it worse?" (e.g., standing, change in head position)     activity 7. HEART RATE: "Can you tell me your heart rate?" "How many beats in 15 seconds?"  (Note: not all patients can do this)       Not sure 8. CAUSE: "What do you think is causing the dizziness?"     Not sure 9. RECURRENT SYMPTOM: "Have you had dizziness before?" If Yes, ask: "When was the last time?" "What happened that time?"     no 10. OTHER SYMPTOMS: "Do you have any other symptoms?" (e.g., fever, chest pain, vomiting, diarrhea, bleeding)       Nausea if does not sit 11. PREGNANCY: "Is there any chance you are pregnant?" "When was your last menstrual period?"       *No Answer*  Protocols used: Dizziness - Lightheadedness-A-AH

## 2021-12-26 NOTE — Telephone Encounter (Signed)
°  Chief Complaint: dizziness Symptoms: dizziness with activity Frequency: almost daily now- started 2 weeks ago Pertinent Negatives: Patient denies dehydration, chest pain, trouble breathing Disposition: [] ED /[] Urgent Care (no appt availability in office) / [x] Appointment(In office/virtual)/ []  [facility]/ [] Home Care/ [] Refused Recommended Disposition /[] [facility]/ []  Follow-up with PCP Additional Notes: Appointment has been scheduled for first available in office - patient advised call back increased symptoms-ED if weekend

## 2021-12-29 ENCOUNTER — Other Ambulatory Visit: Payer: Self-pay

## 2021-12-29 ENCOUNTER — Ambulatory Visit: Payer: Self-pay | Admitting: Internal Medicine

## 2021-12-29 ENCOUNTER — Ambulatory Visit (INDEPENDENT_AMBULATORY_CARE_PROVIDER_SITE_OTHER): Payer: Self-pay | Admitting: Nurse Practitioner

## 2021-12-29 ENCOUNTER — Other Ambulatory Visit: Payer: Self-pay | Admitting: Nurse Practitioner

## 2021-12-29 ENCOUNTER — Encounter: Payer: Self-pay | Admitting: Nurse Practitioner

## 2021-12-29 VITALS — Temp 97.8°F | Wt 205.0 lb

## 2021-12-29 DIAGNOSIS — R42 Dizziness and giddiness: Secondary | ICD-10-CM

## 2021-12-29 DIAGNOSIS — F319 Bipolar disorder, unspecified: Secondary | ICD-10-CM

## 2021-12-29 MED ORDER — LAMOTRIGINE 100 MG PO TABS: 100.0000 mg | ORAL_TABLET | Freq: Every day | ORAL | 1 refills | Status: DC

## 2021-12-29 MED ORDER — MONTELUKAST SODIUM 10 MG PO TABS: 10.0000 mg | ORAL_TABLET | Freq: Every day | ORAL | 1 refills | Status: DC

## 2021-12-29 NOTE — Progress Notes (Signed)
Temp 97.8 F (36.6 C)    Wt 205 lb (93 kg)    SpO2 98%    BMI 37.49 kg/m    Subjective:    Patient ID: Shelby Johns, female    DOB: Jan 27, 1956, 67 y.o.   MRN: [id number]  HPI: Shelby Johns is a 66 y.o. female  Chief Complaint  Patient presents with   unbalanced    Patient states about 2-3 weeks ago she noticed she starts feeling unbalanced, nauseous. Patient states she notices its when she she is doing activity.    DIZZINESS Duration: weeks Description of symptoms: off kilter Duration of episode:  episode builds up. Usually happens at work. Lasts for 10-83mns.  Yesterday her episode lasted for hours. Dizziness frequency:  2-3 x per day. Has not had this before Provoking factors:  activity Aggravating factors:   walking  Triggered by rolling over in bed: no Triggered by bending over: yes Aggravated by head movement: no Aggravated by exertion, coughing, loud noises: yes Recent head injury: no Recent or current viral symptoms: no History of vasovagal episodes: no Nausea: yes Vomiting: no Tinnitus: no Hearing loss: no Aural fullness: yes Headache: yes Photophobia/phonophobia:  when she has a migraine Unsteady gait: yes Postural instability: no Diplopia, dysarthria, dysphagia or weakness:  weakness Related to exertion: yes Pallor: no Diaphoresis: yes Dyspnea: yes Chest pain:  tightness Has been out of her Singulair for about 2 weeks.  Relevant past medical, surgical, family and social history reviewed and updated as indicated. Interim medical history since our last visit reviewed. Allergies and medications reviewed and updated.  Review of Systems  Respiratory:  Positive for cough.   Neurological:  Positive for dizziness.  Psychiatric/Behavioral:  Positive for dysphoric mood.    Per HPI unless specifically indicated above     Objective:    Temp 97.8 F (36.6 C)    Wt 205 lb (93 kg)    SpO2 98%    BMI 37.49 kg/m   Wt Readings from Last 3 Encounters:   12/29/21 205 lb (93 kg)  09/26/21 209 lb (94.8 kg)    Physical Exam Vitals and nursing note reviewed.  Constitutional:      General: She is not in acute distress.    Appearance: Normal appearance. She is normal weight. She is not ill-appearing, toxic-appearing or diaphoretic.  HENT:     Head: Normocephalic.     Right Ear: External ear normal. A middle ear effusion is present.     Left Ear: External ear normal. A middle ear effusion is present.     Nose: Nose normal.     Mouth/Throat:     Mouth: Mucous membranes are moist.     Pharynx: Oropharynx is clear.  Eyes:     General:        Right eye: No discharge.        Left eye: No discharge.     Extraocular Movements: Extraocular movements intact.     Conjunctiva/sclera: Conjunctivae normal.     Pupils: Pupils are equal, round, and reactive to light.  Cardiovascular:     Rate and Rhythm: Normal rate and regular rhythm.     Heart sounds: No murmur heard. Pulmonary:     Effort: Pulmonary effort is normal. No respiratory distress.     Breath sounds: Normal breath sounds. No wheezing or rales.  Musculoskeletal:     Cervical back: Normal range of motion and neck supple.  Skin:    General: Skin is warm and dry.  Capillary Refill: Capillary refill takes less than 2 seconds.  Neurological:     General: No focal deficit present.     Mental Status: She is alert and oriented to person, place, and time. Mental status is at baseline.  Psychiatric:        Mood and Affect: Mood normal.        Behavior: Behavior normal.        Thought Content: Thought content normal.        Judgment: Judgment normal.    Results for orders placed or performed in visit on 09/26/21  Bayer DCA Hb A1c Waived  Result Value Ref Range   HB A1C (BAYER DCA - WAIVED) 5.0 4.8 - 5.6 %  CBC with Differential/Platelet  Result Value Ref Range   WBC 5.9 3.4 - 10.8 x10E3/uL   RBC 4.32 3.77 - 5.28 x10E6/uL   Hemoglobin 12.4 11.1 - 15.9 g/dL   Hematocrit 37.8 34.0 -  46.6 %   MCV 88 79 - 97 fL   MCH 28.7 26.6 - 33.0 pg   MCHC 32.8 31.5 - 35.7 g/dL   RDW 13.4 11.7 - 15.4 %   Platelets 275 150 - 450 x10E3/uL   Neutrophils 48 Not Estab. %   Lymphs 43 Not Estab. %   Monocytes 4 Not Estab. %   Eos 4 Not Estab. %   Basos 1 Not Estab. %   Neutrophils Absolute 2.8 1.4 - 7.0 x10E3/uL   Lymphocytes Absolute 2.5 0.7 - 3.1 x10E3/uL   Monocytes Absolute 0.3 0.1 - 0.9 x10E3/uL   EOS (ABSOLUTE) 0.2 0.0 - 0.4 x10E3/uL   Basophils Absolute 0.1 0.0 - 0.2 x10E3/uL   Immature Granulocytes 0 Not Estab. %   Immature Grans (Abs) 0.0 0.0 - 0.1 x10E3/uL  Comprehensive metabolic panel  Result Value Ref Range   Glucose 83 70 - 99 mg/dL   BUN 12 8 - 27 mg/dL   Creatinine, Ser 0.85 0.57 - 1.00 mg/dL   eGFR 76 >59 mL/min/1.73   BUN/Creatinine Ratio 14 12 - 28   Sodium 141 134 - 144 mmol/L   Potassium 4.3 3.5 - 5.2 mmol/L   Chloride 105 96 - 106 mmol/L   CO2 22 20 - 29 mmol/L   Calcium 8.9 8.7 - 10.3 mg/dL   Total Protein 6.8 6.0 - 8.5 g/dL   Albumin 4.5 3.8 - 4.8 g/dL   Globulin, Total 2.3 1.5 - 4.5 g/dL   Albumin/Globulin Ratio 2.0 1.2 - 2.2   Bilirubin Total 0.4 0.0 - 1.2 mg/dL   Alkaline Phosphatase 100 44 - 121 IU/L   AST 25 0 - 40 IU/L   ALT 13 0 - 32 IU/L  Lipid Panel w/o Chol/HDL Ratio  Result Value Ref Range   Cholesterol, Total 196 100 - 199 mg/dL   Triglycerides 115 0 - 149 mg/dL   HDL 62 >39 mg/dL   VLDL Cholesterol Cal 20 5 - 40 mg/dL   LDL Chol Calc (NIH) 114 (H) 0 - 99 mg/dL  TSH  Result Value Ref Range   TSH 3.710 0.450 - 4.500 uIU/mL  Lamotrigine level  Result Value Ref Range   Lamotrigine Lvl 2.4 2.0 - 20.0 ug/mL  Antinuclear Antib (ANA)  Result Value Ref Range   Anti Nuclear Antibody (ANA) Negative Negative  Rheumatoid Factor  Result Value Ref Range   Rhuematoid fact SerPl-aCnc <10.0 <14.0 IU/mL  HIV Antibody (routine testing w rflx)  Result Value Ref Range   HIV Screen 4th Generation wRfx  Non Reactive Non Reactive  [facility] HEPATITIS C  SCREENING LAB  Result Value Ref Range   HM Hepatitis Screen Negative-Validated   Hemoglobin A1c  Result Value Ref Range   Hemoglobin A1C 5.8   Anti-CCP Ab, IgG + IgA (RDL)  Result Value Ref Range   Anti-CCP Ab, IgG + IgA (RDL) <20 <20 Units      Assessment & Plan:   Problem List Items Addressed This Visit       Other   Bipolar disorder ([place])    Weaned herself off the medication.  However, she feels like she needs the medication.  Would like to restart the medication to help with her mood.       Other Visit Diagnoses     Dizziness    -  Primary   Has been out of Singulair for about 2 weeks. Will refill today. Symptoms were exacerbated since she has been off the medication. Will follow up in 2 weeks.        Follow up plan: Return in about 2 weeks (around 01/12/2022) for Dizziness.    A total of 30 minutes were spent on this encounter today.  When total time is documented, this includes both the face-to-face and non-face-to-face time personally spent before, during and after the visit on the date of the encounter on plan of care for dizziness, reviewing orthostatics, and plan of care for Bipolar.

## 2021-12-29 NOTE — Assessment & Plan Note (Addendum)
Weaned herself off the medication.  However, she feels like she needs the medication.  Would like to restart the medication to help with her mood.

## 2021-12-30 ENCOUNTER — Encounter: Payer: Self-pay | Admitting: Nurse Practitioner

## 2021-12-30 MED ORDER — LEVOTHYROXINE SODIUM 75 MCG PO TABS: 75.0000 ug | ORAL_TABLET | Freq: Every day | ORAL | 1 refills | Status: DC

## 2022-01-01 ENCOUNTER — Encounter: Payer: Self-pay | Admitting: Nurse Practitioner

## 2022-01-01 ENCOUNTER — Ambulatory Visit: Payer: Self-pay

## 2022-01-01 NOTE — Telephone Encounter (Signed)
° °  Chief Complaint: Left ear pain Symptoms: Pain into jaw Frequency: Last night Pertinent Negatives: Patient denies fever Disposition: [] ED /[] Urgent Care (no appt availability in office) / [] Appointment(In office/virtual)/ []  [facility]/ [] Home Care/ [] Refused Recommended Disposition /[] [facility]/ []  Follow-up with PCP Additional Notes: Pt. States she contacted PCP through My Chart and "she told me to call for an appointment." Requests to e worked in this afternoon if possible. Please advise pt. Practice currently closed for lunch.   Answer Assessment - Initial Assessment Questions 1. LOCATION: "Which ear is involved?"     Left ear 2. ONSET: "When did the ear start hurting"      Yesterday 3. SEVERITY: "How bad is the pain?"  (Scale 1-10; mild, moderate or severe)   - MILD (1-3): doesn't interfere with normal activities    - MODERATE (4-7): interferes with normal activities or awakens from sleep    - SEVERE (8-10): excruciating pain, unable to do any normal activities      Severe 4. URI SYMPTOMS: "Do you have a runny nose or cough?"     Allergies 5. FEVER: "Do you have a fever?" If Yes, ask: "What is your temperature, how was it measured, and when did it start?"     No 6. CAUSE: "Have you been swimming recently?", "How often do you use Q-TIPS?", "Have you had any recent air travel or scuba diving?"     No 7. OTHER SYMPTOMS: "Do you have any other symptoms?" (e.g., headache, stiff neck, dizziness, vomiting, runny nose, decreased hearing)     No 8. PREGNANCY: "Is there any chance you are pregnant?" "When was your last menstrual period?"     No  Protocols used: Bethann Punches

## 2022-01-02 ENCOUNTER — Other Ambulatory Visit: Payer: Self-pay

## 2022-01-02 ENCOUNTER — Ambulatory Visit (INDEPENDENT_AMBULATORY_CARE_PROVIDER_SITE_OTHER): Payer: Self-pay | Admitting: Nurse Practitioner

## 2022-01-02 ENCOUNTER — Encounter: Payer: Self-pay | Admitting: Nurse Practitioner

## 2022-01-02 VITALS — BP 123/88 | HR 91 | Temp 98.2°F | Ht 62.01 in | Wt 205.0 lb

## 2022-01-02 DIAGNOSIS — S0300XA Dislocation of jaw, unspecified side, initial encounter: Secondary | ICD-10-CM

## 2022-01-02 MED ORDER — KETOROLAC TROMETHAMINE 60 MG/2ML IM SOLN
60.0000 mg | Freq: Once | INTRAMUSCULAR | Status: AC
  Administered 2022-01-02: 60 mg via INTRAMUSCULAR

## 2022-01-02 MED ORDER — CYCLOBENZAPRINE HCL 5 MG PO TABS: 5.0000 mg | ORAL_TABLET | Freq: Three times a day (TID) | ORAL | 0 refills | Status: DC | PRN

## 2022-01-02 NOTE — Progress Notes (Signed)
BP 123/88    Pulse 91    Temp 98.2 F (36.8 C) (Oral)    Ht 5' 2.01" (1.575 m)    Wt 205 lb (93 kg)    SpO2 98%    BMI 37.49 kg/m    Subjective:    Patient ID: Shelby Johns, female    DOB: [birth year], 66 y.o.   MRN: [id number]  HPI: Shelby Johns is a 66 y.o. female  Chief Complaint  Patient presents with   Ear Pain    Left ear pain   Jaw Pain    Left jaw pain   EAR PAIN Duration: days- started on Wednesday afternoon Involved ear(s): left Severity:  7/10  Quality:  sharp, aching, and pressure-like Fever: no Otorrhea: no Upper respiratory infection symptoms: yes Pruritus: no Hearing loss: no Water immersion no Using Q-tips: yes Recurrent otitis media: no Status: worse Treatments attempted: none  Relevant past medical, surgical, family and social history reviewed and updated as indicated. Interim medical history since our last visit reviewed. Allergies and medications reviewed and updated.  Review of Systems  HENT:  Positive for ear pain.        Jaw pain   Per HPI unless specifically indicated above     Objective:    BP 123/88    Pulse 91    Temp 98.2 F (36.8 C) (Oral)    Ht 5' 2.01" (1.575 m)    Wt 205 lb (93 kg)    SpO2 98%    BMI 37.49 kg/m   Wt Readings from Last 3 Encounters:  01/02/22 205 lb (93 kg)  12/29/21 205 lb (93 kg)  09/26/21 209 lb (94.8 kg)    Physical Exam Vitals and nursing note reviewed.  Constitutional:      General: She is not in acute distress.    Appearance: Normal appearance. She is normal weight. She is not ill-appearing, toxic-appearing or diaphoretic.  HENT:     Head: Normocephalic.     Jaw: Tenderness and pain on movement present.     Right Ear: Tympanic membrane and external ear normal.     Left Ear: Tympanic membrane and external ear normal.     Nose: Nose normal.     Mouth/Throat:     Mouth: Mucous membranes are moist.     Pharynx: Oropharynx is clear.  Eyes:     General:        Right eye: No discharge.         Left eye: No discharge.     Extraocular Movements: Extraocular movements intact.     Conjunctiva/sclera: Conjunctivae normal.     Pupils: Pupils are equal, round, and reactive to light.  Cardiovascular:     Rate and Rhythm: Normal rate and regular rhythm.     Heart sounds: No murmur heard. Pulmonary:     Effort: Pulmonary effort is normal. No respiratory distress.     Breath sounds: Normal breath sounds. No wheezing or rales.  Musculoskeletal:     Cervical back: Normal range of motion and neck supple.  Skin:    General: Skin is warm and dry.     Capillary Refill: Capillary refill takes less than 2 seconds.  Neurological:     General: No focal deficit present.     Mental Status: She is alert and oriented to person, place, and time. Mental status is at baseline.  Psychiatric:        Mood and Affect: Mood normal.  Behavior: Behavior normal.        Thought Content: Thought content normal.        Judgment: Judgment normal.    Results for orders placed or performed in visit on 09/26/21  Bayer DCA Hb A1c Waived  Result Value Ref Range   HB A1C (BAYER DCA - WAIVED) 5.0 4.8 - 5.6 %  CBC with Differential/Platelet  Result Value Ref Range   WBC 5.9 3.4 - 10.8 x10E3/uL   RBC 4.32 3.77 - 5.28 x10E6/uL   Hemoglobin 12.4 11.1 - 15.9 g/dL   Hematocrit 37.8 34.0 - 46.6 %   MCV 88 79 - 97 fL   MCH 28.7 26.6 - 33.0 pg   MCHC 32.8 31.5 - 35.7 g/dL   RDW 13.4 11.7 - 15.4 %   Platelets 275 150 - 450 x10E3/uL   Neutrophils 48 Not Estab. %   Lymphs 43 Not Estab. %   Monocytes 4 Not Estab. %   Eos 4 Not Estab. %   Basos 1 Not Estab. %   Neutrophils Absolute 2.8 1.4 - 7.0 x10E3/uL   Lymphocytes Absolute 2.5 0.7 - 3.1 x10E3/uL   Monocytes Absolute 0.3 0.1 - 0.9 x10E3/uL   EOS (ABSOLUTE) 0.2 0.0 - 0.4 x10E3/uL   Basophils Absolute 0.1 0.0 - 0.2 x10E3/uL   Immature Granulocytes 0 Not Estab. %   Immature Grans (Abs) 0.0 0.0 - 0.1 x10E3/uL  Comprehensive metabolic panel  Result Value Ref  Range   Glucose 83 70 - 99 mg/dL   BUN 12 8 - 27 mg/dL   Creatinine, Ser 0.85 0.57 - 1.00 mg/dL   eGFR 76 >59 mL/min/1.73   BUN/Creatinine Ratio 14 12 - 28   Sodium 141 134 - 144 mmol/L   Potassium 4.3 3.5 - 5.2 mmol/L   Chloride 105 96 - 106 mmol/L   CO2 22 20 - 29 mmol/L   Calcium 8.9 8.7 - 10.3 mg/dL   Total Protein 6.8 6.0 - 8.5 g/dL   Albumin 4.5 3.8 - 4.8 g/dL   Globulin, Total 2.3 1.5 - 4.5 g/dL   Albumin/Globulin Ratio 2.0 1.2 - 2.2   Bilirubin Total 0.4 0.0 - 1.2 mg/dL   Alkaline Phosphatase 100 44 - 121 IU/L   AST 25 0 - 40 IU/L   ALT 13 0 - 32 IU/L  Lipid Panel w/o Chol/HDL Ratio  Result Value Ref Range   Cholesterol, Total 196 100 - 199 mg/dL   Triglycerides 115 0 - 149 mg/dL   HDL 62 >39 mg/dL   VLDL Cholesterol Cal 20 5 - 40 mg/dL   LDL Chol Calc (NIH) 114 (H) 0 - 99 mg/dL  TSH  Result Value Ref Range   TSH 3.710 0.450 - 4.500 uIU/mL  Lamotrigine level  Result Value Ref Range   Lamotrigine Lvl 2.4 2.0 - 20.0 ug/mL  Antinuclear Antib (ANA)  Result Value Ref Range   Anti Nuclear Antibody (ANA) Negative Negative  Rheumatoid Factor  Result Value Ref Range   Rhuematoid fact SerPl-aCnc <10.0 <14.0 IU/mL  HIV Antibody (routine testing w rflx)  Result Value Ref Range   HIV Screen 4th Generation wRfx Non Reactive Non Reactive  [facility] HEPATITIS C SCREENING LAB  Result Value Ref Range   HM Hepatitis Screen Negative-Validated   Hemoglobin A1c  Result Value Ref Range   Hemoglobin A1C 5.8   Anti-CCP Ab, IgG + IgA (RDL)  Result Value Ref Range   Anti-CCP Ab, IgG + IgA (RDL) <20 <20 Units  Assessment & Plan:   Problem List Items Addressed This Visit   None Visit Diagnoses     Dislocation of temporomandibular joint, initial encounter    -  Primary   Will give Toradol in office. Will send flexeril to help with muscle spasm. Side effects and benefits discussed. If not improved next week will send steroids.   Relevant Medications   ketorolac (TORADOL) injection  60 mg (Start on 01/02/2022  1:45 PM)        Follow up plan: Return if symptoms worsen or fail to improve.

## 2022-01-12 ENCOUNTER — Encounter: Payer: Self-pay | Admitting: Nurse Practitioner

## 2022-01-12 ENCOUNTER — Other Ambulatory Visit: Payer: Self-pay

## 2022-01-12 ENCOUNTER — Ambulatory Visit (INDEPENDENT_AMBULATORY_CARE_PROVIDER_SITE_OTHER): Payer: Self-pay | Admitting: Nurse Practitioner

## 2022-01-12 VITALS — BP 137/89 | HR 89 | Temp 97.9°F | Wt 215.4 lb

## 2022-01-12 DIAGNOSIS — R42 Dizziness and giddiness: Secondary | ICD-10-CM

## 2022-01-12 NOTE — Progress Notes (Signed)
BP 137/89    Pulse 89    Temp 97.9 F (36.6 C) (Oral)    Wt 215 lb 6.4 oz (97.7 kg)    SpO2 98%    BMI 39.39 kg/m    Subjective:    Patient ID: Shelby Johns, female    DOB: Aug 08, 1956, 66 y.o.   MRN: [id number]  HPI: Shelby Johns is a 66 y.o. female  Chief Complaint  Patient presents with   Dizziness    2 week f/up- pt states she is much better   DIZZINESS Patient states her symptoms have improved from last week. Patient states that since she restarted the Singulair her symptoms have improved drastically. Denies any symptoms at visit today.    Relevant past medical, surgical, family and social history reviewed and updated as indicated. Interim medical history since our last visit reviewed. Allergies and medications reviewed and updated.  Review of Systems  Neurological:  Negative for dizziness.  Psychiatric/Behavioral:  Positive for dysphoric mood.    Per HPI unless specifically indicated above     Objective:    BP 137/89    Pulse 89    Temp 97.9 F (36.6 C) (Oral)    Wt 215 lb 6.4 oz (97.7 kg)    SpO2 98%    BMI 39.39 kg/m   Wt Readings from Last 3 Encounters:  01/12/22 215 lb 6.4 oz (97.7 kg)  01/02/22 205 lb (93 kg)  12/29/21 205 lb (93 kg)    Physical Exam Vitals and nursing note reviewed.  Constitutional:      General: She is not in acute distress.    Appearance: Normal appearance. She is normal weight. She is not ill-appearing, toxic-appearing or diaphoretic.  HENT:     Head: Normocephalic.     Right Ear: External ear normal. A middle ear effusion is present.     Left Ear: External ear normal. A middle ear effusion is present.     Nose: Nose normal.     Mouth/Throat:     Mouth: Mucous membranes are moist.     Pharynx: Oropharynx is clear.  Eyes:     General:        Right eye: No discharge.        Left eye: No discharge.     Extraocular Movements: Extraocular movements intact.     Conjunctiva/sclera: Conjunctivae normal.     Pupils: Pupils are  equal, round, and reactive to light.  Cardiovascular:     Rate and Rhythm: Normal rate and regular rhythm.     Heart sounds: No murmur heard. Pulmonary:     Effort: Pulmonary effort is normal. No respiratory distress.     Breath sounds: Normal breath sounds. No wheezing or rales.  Musculoskeletal:     Cervical back: Normal range of motion and neck supple.  Skin:    General: Skin is warm and dry.     Capillary Refill: Capillary refill takes less than 2 seconds.  Neurological:     General: No focal deficit present.     Mental Status: She is alert and oriented to person, place, and time. Mental status is at baseline.  Psychiatric:        Mood and Affect: Mood normal.        Behavior: Behavior normal.        Thought Content: Thought content normal.        Judgment: Judgment normal.    Results for orders placed or performed in visit on 09/26/21  [employer]  DCA Hb A1c Waived  Result Value Ref Range   HB A1C (BAYER DCA - WAIVED) 5.0 4.8 - 5.6 %  CBC with Differential/Platelet  Result Value Ref Range   WBC 5.9 3.4 - 10.8 x10E3/uL   RBC 4.32 3.77 - 5.28 x10E6/uL   Hemoglobin 12.4 11.1 - 15.9 g/dL   Hematocrit 37.8 34.0 - 46.6 %   MCV 88 79 - 97 fL   MCH 28.7 26.6 - 33.0 pg   MCHC 32.8 31.5 - 35.7 g/dL   RDW 13.4 11.7 - 15.4 %   Platelets 275 150 - 450 x10E3/uL   Neutrophils 48 Not Estab. %   Lymphs 43 Not Estab. %   Monocytes 4 Not Estab. %   Eos 4 Not Estab. %   Basos 1 Not Estab. %   Neutrophils Absolute 2.8 1.4 - 7.0 x10E3/uL   Lymphocytes Absolute 2.5 0.7 - 3.1 x10E3/uL   Monocytes Absolute 0.3 0.1 - 0.9 x10E3/uL   EOS (ABSOLUTE) 0.2 0.0 - 0.4 x10E3/uL   Basophils Absolute 0.1 0.0 - 0.2 x10E3/uL   Immature Granulocytes 0 Not Estab. %   Immature Grans (Abs) 0.0 0.0 - 0.1 x10E3/uL  Comprehensive metabolic panel  Result Value Ref Range   Glucose 83 70 - 99 mg/dL   BUN 12 8 - 27 mg/dL   Creatinine, Ser 0.85 0.57 - 1.00 mg/dL   eGFR 76 >59 mL/min/1.73   BUN/Creatinine Ratio 14  12 - 28   Sodium 141 134 - 144 mmol/L   Potassium 4.3 3.5 - 5.2 mmol/L   Chloride 105 96 - 106 mmol/L   CO2 22 20 - 29 mmol/L   Calcium 8.9 8.7 - 10.3 mg/dL   Total Protein 6.8 6.0 - 8.5 g/dL   Albumin 4.5 3.8 - 4.8 g/dL   Globulin, Total 2.3 1.5 - 4.5 g/dL   Albumin/Globulin Ratio 2.0 1.2 - 2.2   Bilirubin Total 0.4 0.0 - 1.2 mg/dL   Alkaline Phosphatase 100 44 - 121 IU/L   AST 25 0 - 40 IU/L   ALT 13 0 - 32 IU/L  Lipid Panel w/o Chol/HDL Ratio  Result Value Ref Range   Cholesterol, Total 196 100 - 199 mg/dL   Triglycerides 115 0 - 149 mg/dL   HDL 62 >39 mg/dL   VLDL Cholesterol Cal 20 5 - 40 mg/dL   LDL Chol Calc (NIH) 114 (H) 0 - 99 mg/dL  TSH  Result Value Ref Range   TSH 3.710 0.450 - 4.500 uIU/mL  Lamotrigine level  Result Value Ref Range   Lamotrigine Lvl 2.4 2.0 - 20.0 ug/mL  Antinuclear Antib (ANA)  Result Value Ref Range   Anti Nuclear Antibody (ANA) Negative Negative  Rheumatoid Factor  Result Value Ref Range   Rhuematoid fact SerPl-aCnc <10.0 <14.0 IU/mL  HIV Antibody (routine testing w rflx)  Result Value Ref Range   HIV Screen 4th Generation wRfx Non Reactive Non Reactive  [facility] HEPATITIS C SCREENING LAB  Result Value Ref Range   HM Hepatitis Screen Negative-Validated   Hemoglobin A1c  Result Value Ref Range   Hemoglobin A1C 5.8   Anti-CCP Ab, IgG + IgA (RDL)  Result Value Ref Range   Anti-CCP Ab, IgG + IgA (RDL) <20 <20 Units      Assessment & Plan:   Problem List Items Addressed This Visit   None Visit Diagnoses     Dizziness    -  Primary   Improved since restarted the Singulair. Continue with medication daily.  Advised on adverse effects. Follow up if symptoms do not improve.        Follow up plan: Return if symptoms worsen or fail to improve.

## 2022-02-02 ENCOUNTER — Other Ambulatory Visit: Payer: Self-pay | Admitting: Nurse Practitioner

## 2022-02-02 ENCOUNTER — Other Ambulatory Visit: Payer: Self-pay

## 2022-02-02 ENCOUNTER — Encounter: Payer: Self-pay | Admitting: Nurse Practitioner

## 2022-02-02 MED ORDER — ACETAMINOPHEN 500 MG PO TABS
1000.0000 mg | ORAL_TABLET | Freq: Four times a day (QID) | ORAL | 0 refills | Status: AC | PRN
  Filled 2022-02-02: qty 30, 4d supply, fill #0

## 2022-02-02 NOTE — Telephone Encounter (Signed)
Can this be written as an RX?

## 2022-02-02 NOTE — Telephone Encounter (Signed)
Pts called to advised that her insurance faxed a request to office to refill Losartan (COZAAR) 25 MG tablet / pt needs a new Rx sent to [id number] [place], [place] - [address] AT [facility] OF [address]  [address], [place] [address]  Phone:  [phone]  Fax:  [phone]   Her insurance will not cover refills for 90 tablets at a time so pharmacy would like new Rx for 30 day supplies / please advise asap / pt has been out of medication for 3 days now

## 2022-02-03 MED ORDER — LOSARTAN POTASSIUM 25 MG PO TABS: 25.0000 mg | ORAL_TABLET | Freq: Every day | ORAL | 0 refills | Status: DC

## 2022-03-31 ENCOUNTER — Encounter: Payer: Self-pay | Admitting: Nurse Practitioner

## 2022-04-01 ENCOUNTER — Encounter: Payer: Self-pay | Admitting: Nurse Practitioner

## 2022-04-01 ENCOUNTER — Ambulatory Visit (INDEPENDENT_AMBULATORY_CARE_PROVIDER_SITE_OTHER): Payer: Self-pay | Admitting: Nurse Practitioner

## 2022-04-01 VITALS — BP 148/94 | HR 80 | Temp 98.0°F | Ht 62.5 in | Wt 212.0 lb

## 2022-04-01 DIAGNOSIS — Z136 Encounter for screening for cardiovascular disorders: Secondary | ICD-10-CM

## 2022-04-01 DIAGNOSIS — R053 Chronic cough: Secondary | ICD-10-CM

## 2022-04-01 DIAGNOSIS — Z23 Encounter for immunization: Secondary | ICD-10-CM

## 2022-04-01 DIAGNOSIS — F319 Bipolar disorder, unspecified: Secondary | ICD-10-CM

## 2022-04-01 DIAGNOSIS — I10 Essential (primary) hypertension: Secondary | ICD-10-CM

## 2022-04-01 DIAGNOSIS — Z1231 Encounter for screening mammogram for malignant neoplasm of breast: Secondary | ICD-10-CM

## 2022-04-01 DIAGNOSIS — E669 Obesity, unspecified: Secondary | ICD-10-CM

## 2022-04-01 DIAGNOSIS — Z0000 Encounter for general adult medical examination without abnormal findings: Secondary | ICD-10-CM

## 2022-04-01 DIAGNOSIS — E039 Hypothyroidism, unspecified: Secondary | ICD-10-CM

## 2022-04-01 DIAGNOSIS — R7303 Prediabetes: Secondary | ICD-10-CM

## 2022-04-01 DIAGNOSIS — M797 Fibromyalgia: Secondary | ICD-10-CM

## 2022-04-01 DIAGNOSIS — F32A Depression, unspecified: Secondary | ICD-10-CM

## 2022-04-01 DIAGNOSIS — Z1211 Encounter for screening for malignant neoplasm of colon: Secondary | ICD-10-CM

## 2022-04-01 MED ORDER — GABAPENTIN 100 MG PO CAPS: 100.0000 mg | ORAL_CAPSULE | Freq: Every day | ORAL | 0 refills | Status: DC

## 2022-04-01 MED ORDER — KETOROLAC TROMETHAMINE 60 MG/2ML IM SOLN
60.0000 mg | Freq: Once | INTRAMUSCULAR | Status: AC
  Administered 2022-04-01: 60 mg via INTRAMUSCULAR

## 2022-04-01 NOTE — Progress Notes (Signed)
? ?BP (!) 148/94   Pulse 80   Temp 98 ?F (36.7 ?C) (Oral)   Ht 5' 2.5" (1.588 m)   Wt 212 lb (96.2 kg)   SpO2 98%   BMI 38.16 kg/m?   ? ?Subjective:  ? ? Patient ID: Shelby Johns, female    DOB: [birth year], 66 y.o.   MRN: [id number] ? ?HPI: ?Shelby Johns is a 66 y.o. female presenting on 04/01/2022 for comprehensive medical examination. Current medical complaints include: cough ? ?She currently lives with: ?Menopausal Symptoms: no ? ?HYPERTENSION / HYPERLIPIDEMIA ?Satisfied with current treatment? no ?Duration of hypertension: years ?BP monitoring frequency:  not regularly ?BP range:  ?BP medication side effects: no ?Past BP meds: losartan (cozaar) ?Duration of hyperlipidemia: years ?Cholesterol medication side effects: no ?Cholesterol supplements: none ?Past cholesterol medications: atorvastain (lipitor), niaspan, and lovaza ?Medication compliance: excellent compliance ?Aspirin: no ?Recent stressors: no ?Recurrent headaches: yes ?Visual changes: no ?Palpitations: no ?Dyspnea: no ?Chest pain: no ?Lower extremity edema: no ?Dizzy/lightheaded: no ? ?HYPOTHYROIDISM ?Thyroid control status:controlled ?Satisfied with current treatment? no ?Medication side effects: no ?Medication compliance: excellent compliance ?Etiology of hypothyroidism:  ?Recent dose adjustment:no ?Fatigue: no ?Cold intolerance: no ?Heat intolerance: no ?Weight gain: no ?Weight loss: no ?Constipation: no ?Diarrhea/loose stools: no ?Palpitations: no ?Lower extremity edema: no ?Anxiety/depressed mood: no ? ?COUGH ?Patient has had persistent ongoing cough.  She has been taking pseudoephedrine which does help with the cough.  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   ? ?PAIN ?Patient has been having pain mostly on her arms that has been ongoing.  At last visit she received Toradol which helped her symptoms for about a month.  Wondering if she can get another Toradol  Injection. ? ?MOOD ?Patient states her mood has been much better especially since the sun is out and she has been more active.  ? ?Depression Screen done today and results listed below:  ? ?  04/01/2022  ?  3:48 PM 01/12/2022  ?  2:26 PM 01/02/2022  ?  1:45 PM 12/29/2021  ? 12:02 PM 09/26/2021  ?  1:14 PM  ?Depression screen PHQ 2/9  ?Decreased Interest 1 1 1 2  0  ?Down, Depressed, Hopeless 1 0 1 2 0  ?PHQ - 2 Score 2 1 2 4  0  ?Altered sleeping 2 2 2 2 1   ?Tired, decreased energy 1 2 2 2 1   ?Change in appetite 2 0 1 0 0  ?Feeling bad or failure about yourself  0 0 0 0 0  ?Trouble concentrating 1 1 1 2 1   ?Moving slowly or fidgety/restless 0 0 1 0 0  ?Suicidal thoughts 0 0 0 0 0  ?PHQ-9 Score 8 6 9 10 3   ?Difficult doing work/chores Somewhat difficult Not difficult at all   Not difficult at all  ? ? ?The patient does not have a history of falls. I did complete a risk assessment for falls. A plan of care for falls was documented. ? ? ?Past Medical History:  ?Past Medical History:  ?Diagnosis Date  ? Allergies   ? Bipolar 2 disorder (HCC)   ? Hypertension   ? Prediabetes   ? Thyroid disease   ? ? ?Surgical History:  ?Past Surgical History:  ?Procedure Laterality Date  ? BACK SURGERY    ? BREAST BIOPSY Right 11/26/2020  ? bx, vision marker, path pending  ? BUNIONECTOMY Left   ?  NOSE SURGERY    ? REPLACEMENT TOTAL KNEE Right   ? TONSILLECTOMY    ? age 89  ? WISDOM TOOTH EXTRACTION    ? ? ?Medications:  ?Current Outpatient Medications on File Prior to Visit  ?Medication Sig  ? acetaminophen (TYLENOL) 500 MG tablet Take 2 tablets (1,000 mg total) by mouth every 6 (six) hours as needed.  ? cetirizine-pseudoephedrine (ZYRTEC-D) 5-120 MG tablet Take 1 tablet by mouth 2 (two) times daily.  ? lamoTRIgine (LAMICTAL) 100 MG tablet Take 1 tablet (100 mg total) by mouth daily.  ? levothyroxine (SYNTHROID) 75 MCG tablet Take 1 tablet (75 mcg total) by mouth daily.  ? losartan (COZAAR) 25 MG tablet Take 1 tablet (25 mg total) by mouth  daily.  ? montelukast (SINGULAIR) 10 MG tablet Take 1 tablet (10 mg total) by mouth daily.  ? Cetirizine HCl 10 MG CAPS Take by mouth. (Patient not taking: Reported on 04/01/2022)  ? cyclobenzaprine (FLEXERIL) 5 MG tablet Take 1 tablet (5 mg total) by mouth 3 (three) times daily as needed for muscle spasms. (Patient not taking: Reported on 01/12/2022)  ? furosemide (LASIX) 20 MG tablet Take 1 tablet (20 mg total) by mouth every 3 (three) days. (Patient not taking: Reported on 04/01/2022)  ? ?No current facility-administered medications on file prior to visit.  ? ? ?Allergies:  ?Allergies  ?Allergen Reactions  ? Meperidine Nausea And Vomiting  ? Blue Dyes (Parenteral) Dermatitis  ? Lisinopril   ? Mucinex [Guaifenesin Er] Dermatitis  ? Nsaids   ? Petroleum Distillate Dermatitis  ? Petroleum Ether Dermatitis  ? Hydrochlorothiazide Itching, Rash and Swelling  ? Other Rash  ?  4-tert-butylphenol formaldehyde resin: allergic contact dermatitis ?Paraphenylenediamine: allergic contact dermatitis ?Disperse blue: allergic contact dermatitis ?Dispersed blue dye ?  ? Petrolatum Rash  ?  !!!!!ALLERGIC TO ANY SYNTHETIC FIBERS WITH A RASH REACTION!!!! ?!!!!!ALLERGIC TO ANY SYNTHETIC FIBERS WITH A RASH REACTION!!!! ?  ? Tape Rash  ? White Petrolatum Rash  ? ? ?Social History:  ?Social History  ? ?Socioeconomic History  ? Marital status: Single  ?  Spouse name: Not on file  ? Number of children: Not on file  ? Years of education: Not on file  ? Highest education level: Not on file  ?Occupational History  ? Not on file  ?Tobacco Use  ? Smoking status: Never  ? Smokeless tobacco: Never  ?Vaping Use  ? Vaping Use: Never used  ?Substance and Sexual Activity  ? Alcohol use: Yes  ?  Alcohol/week: 2.0 standard drinks  ?  Types: 2 Glasses of wine per week  ? Drug use: Never  ? Sexual activity: Not Currently  ?  Partners: Male  ?Other Topics Concern  ? Not on file  ?Social History Narrative  ? Not on file  ? ?Social Determinants of Health   ? ?Financial Resource Strain: Not on file  ?Food Insecurity: Not on file  ?Transportation Needs: Not on file  ?Physical Activity: Not on file  ?Stress: Not on file  ?Social Connections: Not on file  ?Intimate Partner Violence: Not on file  ? ?Social History  ? ?Tobacco Use  ?Smoking Status Never  ?Smokeless Tobacco Never  ? ?Social History  ? ?Substance and Sexual Activity  ?Alcohol Use Yes  ? Alcohol/week: 2.0 standard drinks  ? Types: 2 Glasses of wine per week  ? ? ?Family History:  ?Family History  ?Problem Relation Age of Onset  ? Hypertension Mother   ? Colon cancer Mother   ?  Alzheimer's disease Mother   ? Diabetes Father   ? Hypertension Father   ? Arrhythmia Father   ? Cancer Brother   ? Schizophrenia Maternal Grandmother   ? Heart failure Maternal Grandfather   ? Breast cancer Neg Hx   ? ? ?Past medical history, surgical history, medications, allergies, family history and social history reviewed with patient today and changes made to appropriate areas of the chart.  ? ?Review of Systems  ?Constitutional:  Negative for malaise/fatigue and weight loss.  ?Eyes:  Negative for blurred vision and double vision.  ?Respiratory:  Positive for cough. Negative for shortness of breath.   ?Cardiovascular:  Negative for chest pain, palpitations and leg swelling.  ?Gastrointestinal:  Negative for constipation and diarrhea.  ?Musculoskeletal:   ?     Arm pain  ?Neurological:  Negative for dizziness and headaches.  ?Psychiatric/Behavioral:  Negative for depression. The patient is not nervous/anxious.   ?All other ROS negative except what is listed above and in the HPI.  ? ?   ?Objective:  ?  ?BP (!) 148/94   Pulse 80   Temp 98 ?F (36.7 ?C) (Oral)   Ht 5' 2.5" (1.588 m)   Wt 212 lb (96.2 kg)   SpO2 98%   BMI 38.16 kg/m?   ?Wt Readings from Last 3 Encounters:  ?04/01/22 212 lb (96.2 kg)  ?01/12/22 215 lb 6.4 oz (97.7 kg)  ?01/02/22 205 lb (93 kg)  ?  ?Physical Exam ?Vitals and nursing note reviewed.  ?Constitutional:    ?   General: She is awake. She is not in acute distress. ?   Appearance: Normal appearance. She is well-developed. She is obese. She is not ill-appearing.  ?HENT:  ?   Head: Normocephalic and atraumatic.  ?   Right Ear: He

## 2022-04-01 NOTE — Patient Instructions (Signed)
Please call to schedule your mammogram and/or bone density: ?[facility]  ?Address: [address], , [place] [address] ?Phone: [phone]  ?

## 2022-04-02 ENCOUNTER — Telehealth: Payer: Self-pay

## 2022-04-02 DIAGNOSIS — R053 Chronic cough: Secondary | ICD-10-CM | POA: Insufficient documentation

## 2022-04-02 DIAGNOSIS — M797 Fibromyalgia: Secondary | ICD-10-CM | POA: Insufficient documentation

## 2022-04-02 LAB — CBC WITH DIFFERENTIAL/PLATELET
Basophils Absolute: 0.1 10*3/uL (ref 0.0–0.2)
Basos: 1 %
EOS (ABSOLUTE): 0.2 10*3/uL (ref 0.0–0.4)
Eos: 4 %
Hematocrit: 37.1 % (ref 34.0–46.6)
Hemoglobin: 12.9 g/dL (ref 11.1–15.9)
Immature Grans (Abs): 0 10*3/uL (ref 0.0–0.1)
Immature Granulocytes: 0 %
Lymphocytes Absolute: 2.7 10*3/uL (ref 0.7–3.1)
Lymphs: 41 %
MCH: 30 pg (ref 26.6–33.0)
MCHC: 34.8 g/dL (ref 31.5–35.7)
MCV: 86 fL (ref 79–97)
Monocytes Absolute: 0.3 10*3/uL (ref 0.1–0.9)
Monocytes: 5 %
Neutrophils Absolute: 3.3 10*3/uL (ref 1.4–7.0)
Neutrophils: 49 %
Platelets: 288 10*3/uL (ref 150–450)
RBC: 4.3 x10E6/uL (ref 3.77–5.28)
RDW: 13.2 % (ref 11.7–15.4)
WBC: 6.6 10*3/uL (ref 3.4–10.8)

## 2022-04-02 LAB — URINALYSIS, ROUTINE W REFLEX MICROSCOPIC
Bilirubin, UA: NEGATIVE
Glucose, UA: NEGATIVE
Ketones, UA: NEGATIVE
Nitrite, UA: NEGATIVE
Protein,UA: NEGATIVE
RBC, UA: NEGATIVE
Specific Gravity, UA: 1.025 (ref 1.005–1.030)
Urobilinogen, Ur: 0.2 mg/dL (ref 0.2–1.0)
pH, UA: 5.5 (ref 5.0–7.5)

## 2022-04-02 LAB — COMPREHENSIVE METABOLIC PANEL
ALT: 14 IU/L (ref 0–32)
AST: 23 IU/L (ref 0–40)
Albumin/Globulin Ratio: 2.1 (ref 1.2–2.2)
Albumin: 4.6 g/dL (ref 3.8–4.8)
Alkaline Phosphatase: 110 IU/L (ref 44–121)
BUN/Creatinine Ratio: 13 (ref 12–28)
BUN: 13 mg/dL (ref 8–27)
Bilirubin Total: 0.3 mg/dL (ref 0.0–1.2)
CO2: 25 mmol/L (ref 20–29)
Calcium: 9.3 mg/dL (ref 8.7–10.3)
Chloride: 103 mmol/L (ref 96–106)
Creatinine, Ser: 0.98 mg/dL (ref 0.57–1.00)
Globulin, Total: 2.2 g/dL (ref 1.5–4.5)
Glucose: 98 mg/dL (ref 70–99)
Potassium: 4.4 mmol/L (ref 3.5–5.2)
Sodium: 142 mmol/L (ref 134–144)
Total Protein: 6.8 g/dL (ref 6.0–8.5)
eGFR: 64 mL/min/{1.73_m2} (ref >59)

## 2022-04-02 LAB — LIPID PANEL
Chol/HDL Ratio: 3.3 ratio (ref 0.0–4.4)
Cholesterol, Total: 213 mg/dL — ABNORMAL HIGH (ref 100–199)
HDL: 64 mg/dL (ref >39)
LDL Chol Calc (NIH): 118 mg/dL — ABNORMAL HIGH (ref 0–99)
Triglycerides: 177 mg/dL — ABNORMAL HIGH (ref 0–149)
VLDL Cholesterol Cal: 31 mg/dL (ref 5–40)

## 2022-04-02 LAB — MICROSCOPIC EXAMINATION
Bacteria, UA: NONE SEEN
Epithelial Cells (non renal): NONE SEEN /hpf (ref 0–10)
RBC, Urine: NONE SEEN /hpf (ref 0–2)

## 2022-04-02 LAB — T4, FREE: Free T4: 1.33 ng/dL (ref 0.82–1.77)

## 2022-04-02 LAB — TSH: TSH: 2.23 u[IU]/mL (ref 0.450–4.500)

## 2022-04-02 LAB — HEMOGLOBIN A1C
Est. average glucose Bld gHb Est-mCnc: 108 mg/dL
Hgb A1c MFr Bld: 5.4 % (ref 4.8–5.6)

## 2022-04-02 NOTE — Assessment & Plan Note (Signed)
Chronic. Has been struggling with pain in her arms. Has areas on back left tricep and as well as right bicep and forearm. Received Toradol at last visit which helped her pain for about a month.  Will repeat Toradol dose in office today.  Will start Gabapentin 100mg  QHS. Discussed side effects and benefits of medication with patient during visit.  Follow up in 1 month for reevaluation.  ?

## 2022-04-02 NOTE — Assessment & Plan Note (Signed)
Chronic. Not well controlled.  Suspect it is due to use of Pseudoephedrine.  Discussed stopping Pseudoephedrine use with patient during visit.  Will follow up in 1 month.  If still elevated at that time will increase dose of Losartan.  Call sooner if concerns arise.  ?

## 2022-04-02 NOTE — Assessment & Plan Note (Addendum)
Ongoing.  Has seen ENT who did not have much to offer.  States Zyrtec and Montelukast only help some due to allergy being to metals and not environmental allergies.  Will refer patient to Dr Sherene Sires, Pulmonologist, who specializes in cough. Advised patient not to use Pseudoephedrine. ?

## 2022-04-02 NOTE — Progress Notes (Signed)
Hi Shelby Johns. It was nice to see you yesterday.  Your lab work looks good.  Your cholesterol is elevated.  I recommend following a low fat diet and exercising as tolerated.  Your Cardiac risk score is elevated which puts you at higher risk of stroke and heart attack.  I recommend starting Crestor 5mg  daily.  If you agree, I can send this to the pharmacy.  Continue with your current medication regimen.  Follow up as discussed.  Please let me know if you have any questions.  ? ?The 10-year ASCVD risk score (Arnett DK, et al., 2019) is: 8.6% ?  Values used to calculate the score: ?    Age: 66 years ?    Sex: Female ?    Is Non-Hispanic African American: No ?    Diabetic: No ?    Tobacco smoker: No ?    Systolic Blood Pressure: 123456 mmHg ?    Is BP treated: Yes ?    HDL Cholesterol: 64 mg/dL ?    Total Cholesterol: 213 mg/dL ?

## 2022-04-02 NOTE — Assessment & Plan Note (Signed)
Labs ordered today.  Will make recommendations based on lab results. ?

## 2022-04-02 NOTE — Assessment & Plan Note (Signed)
Chronic.  Controlled.  Continue with current medication regimen.  Labs ordered today.  Return to clinic in 6 months for reevaluation.  Call sooner if concerns arise.  ? ?

## 2022-04-02 NOTE — Assessment & Plan Note (Signed)
Recommend a healthy lifestyle through diet and exercise.  Recommend smaller meals that prioritize protein.  °

## 2022-04-02 NOTE — Telephone Encounter (Signed)
CALLED PATIENT NO ANSWER LEFT VOICEMAIL FOR A CALL BACK ? ?

## 2022-04-07 ENCOUNTER — Telehealth: Payer: Self-pay

## 2022-04-07 NOTE — Telephone Encounter (Signed)
CALLED PATIENT NO ANSWER LEFT VOICEMAIL FOR A CALL BACK °Letter sent °

## 2022-04-28 ENCOUNTER — Other Ambulatory Visit: Payer: Self-pay | Admitting: Nurse Practitioner

## 2022-04-29 NOTE — Telephone Encounter (Signed)
Requested Prescriptions  Pending Prescriptions Disp Refills  . gabapentin (NEURONTIN) 100 MG capsule [Pharmacy Med Name: GABAPENTIN 100MG  CAPSULES] 30 capsule 0    Sig: TAKE 1 CAPSULE(100 MG) BY MOUTH AT BEDTIME     Neurology: Anticonvulsants - gabapentin Passed - 04/28/2022  2:00 PM      Passed - Cr in normal range and within 360 days    Creatinine, Ser  Date Value Ref Range Status  04/01/2022 0.98 0.57 - 1.00 mg/dL Final         Passed - Completed PHQ-2 or PHQ-9 in the last 360 days      Passed - Valid encounter within last 12 months    Recent Outpatient Visits          4 weeks ago Annual physical exam   [facility], NP   3 months ago Dizziness   [facility], NP   3 months ago Dislocation of temporomandibular joint, initial encounter   [facility], NP   4 months ago Dizziness   [facility] [place] [place], [place]   7 months ago HTN, goal below 140/90   [facility]

## 2022-06-25 ENCOUNTER — Other Ambulatory Visit: Payer: Self-pay | Admitting: Nurse Practitioner

## 2022-06-26 NOTE — Telephone Encounter (Signed)
Requested Prescriptions  Pending Prescriptions Disp Refills  . levothyroxine (SYNTHROID) 75 MCG tablet [Pharmacy Med Name: LEVOTHYROXINE 0.075MG  ( ) TABS] 90 tablet 2    Sig: TAKE 1 TABLET(75 MCG) BY MOUTH DAILY     Endocrinology:  Hypothyroid Agents Passed - 06/25/2022  7:02 AM      Passed - TSH in normal range and within 360 days    TSH  Date Value Ref Range Status  04/01/2022 2.230 0.450 - 4.500 uIU/mL Final         Passed - Valid encounter within last 12 months    Recent Outpatient Visits          2 months ago Annual physical exam   [facility], NP   5 months ago Dizziness   [facility] Lar[facility] Grooms, NP   5 months ago Dislocation of temporomandibular joint, initial encounter   [facility], NP   5 months ago Dizziness   [facility], Clydie Braun, NP   9 months ago HTN, goal below 140/90   [facility], Lauren A, NP             . montelukast (SINGULAIR) 10 MG tablet [Pharmacy Med Name: MONTELUKAST 10MG  TABLETS] 90 tablet 2    Sig: TAKE 1 TABLET(10 MG) BY MOUTH DAILY     Pulmonology:  Leukotriene Inhibitors Passed - 06/25/2022  7:02 AM      Passed - Valid encounter within last 12 months    Recent Outpatient Visits          2 months ago Annual physical exam   [facility], NP   5 months ago Dizziness   [facility], NP   5 months ago Dislocation of temporomandibular joint, initial encounter   [facility], NP   5 months ago Dizziness   [facility], NP   9 months ago HTN, goal below 140/90   [facility], NP

## 2022-08-19 ENCOUNTER — Ambulatory Visit (INDEPENDENT_AMBULATORY_CARE_PROVIDER_SITE_OTHER): Payer: Medicare HMO | Admitting: Nurse Practitioner

## 2022-08-19 ENCOUNTER — Encounter: Payer: Self-pay | Admitting: Nurse Practitioner

## 2022-08-19 ENCOUNTER — Ambulatory Visit
Admission: RE | Admit: 2022-08-19 | Discharge: 2022-08-19 | Disposition: A | Discharge status: Home / Self Care | Payer: Medicare HMO | Attending: Nurse Practitioner | Admitting: Nurse Practitioner

## 2022-08-19 ENCOUNTER — Ambulatory Visit
Admission: RE | Admit: 2022-08-19 | Discharge: 2022-08-19 | Disposition: A | Discharge status: Home / Self Care | Payer: Medicare HMO | Source: Ambulatory Visit | Attending: Nurse Practitioner | Admitting: Nurse Practitioner

## 2022-08-19 VITALS — BP 114/73 | HR 71 | Temp 97.8°F | Wt 213.2 lb

## 2022-08-19 DIAGNOSIS — M25551 Pain in right hip: Secondary | ICD-10-CM

## 2022-08-19 MED ORDER — LAMOTRIGINE 100 MG PO TABS: 100.0000 mg | ORAL_TABLET | Freq: Every day | ORAL | 1 refills | Status: DC

## 2022-08-19 NOTE — Progress Notes (Signed)
BP 114/73   Pulse 71   Temp 97.8 F (36.6 C) (Oral)   Wt 213 lb 3.2 oz (96.7 kg)   SpO2 98%   BMI 38.37 kg/m    Subjective:    Patient ID: Shelby Johns, female    DOB: [birth year], 66 y.o.   MRN: [id number]  HPI: Shelby Johns is a 66 y.o. female  Chief Complaint  Patient presents with   Hip Pain    Patient reports hip/groin pain, intermittent. Reports tingling sensation down RLE. Denies recent fall/injury.    HIP PAIN Duration:  about a month Involved hip: right  Mechanism of injury: unknown Location: medial Onset: gradual  Severity:  between 0-9/10   Quality:  numbness and tingling and pressure-like Frequency: intermittent Radiation: yes Aggravating factors:  pressure on the area    Alleviating factors:  massage and rest  Status: worse Treatments attempted:  tylenol and massage and rest   Relief with NSAIDs?: No NSAIDs Taken Weakness with weight bearing: yes Weakness with walking: yes Paresthesias / decreased sensation: no Swelling: no Redness:no Fevers: no  Relevant past medical, surgical, family and social history reviewed and updated as indicated. Interim medical history since our last visit reviewed. Allergies and medications reviewed and updated.  Review of Systems  Musculoskeletal:        Right hip pain    Per HPI unless specifically indicated above     Objective:    BP 114/73   Pulse 71   Temp 97.8 F (36.6 C) (Oral)   Wt 213 lb 3.2 oz (96.7 kg)   SpO2 98%   BMI 38.37 kg/m   Wt Readings from Last 3 Encounters:  08/19/22 213 lb 3.2 oz (96.7 kg)  04/01/22 212 lb (96.2 kg)  01/12/22 215 lb 6.4 oz (97.7 kg)    Physical Exam Vitals and nursing note reviewed.  Constitutional:      General: She is not in acute distress.    Appearance: Normal appearance. She is normal weight. She is not ill-appearing, toxic-appearing or diaphoretic.  HENT:     Head: Normocephalic.     Right Ear: External ear normal.     Left Ear: External ear normal.      Nose: Nose normal.     Mouth/Throat:     Mouth: Mucous membranes are moist.     Pharynx: Oropharynx is clear.  Eyes:     General:        Right eye: No discharge.        Left eye: No discharge.     Extraocular Movements: Extraocular movements intact.     Conjunctiva/sclera: Conjunctivae normal.     Pupils: Pupils are equal, round, and reactive to light.  Cardiovascular:     Rate and Rhythm: Normal rate and regular rhythm.     Heart sounds: No murmur heard. Pulmonary:     Effort: Pulmonary effort is normal. No respiratory distress.     Breath sounds: Normal breath sounds. No wheezing or rales.  Musculoskeletal:     Cervical back: Normal range of motion and neck supple.     Right lower leg: Tenderness present.       Legs:  Skin:    General: Skin is warm and dry.     Capillary Refill: Capillary refill takes less than 2 seconds.  Neurological:     General: No focal deficit present.     Mental Status: She is alert and oriented to person, place, and time. Mental status is at baseline.  Psychiatric:        Mood and Affect: Mood normal.        Behavior: Behavior normal.        Thought Content: Thought content normal.        Judgment: Judgment normal.     Results for orders placed or performed in visit on 04/01/22  Microscopic Examination   Urine  Result Value Ref Range   WBC, UA 0-5 0 - 5 /hpf   RBC, Urine None seen 0 - 2 /hpf   Epithelial Cells (non renal) None seen 0 - 10 /hpf   Bacteria, UA None seen None seen/Few  CBC with Differential/Platelet  Result Value Ref Range   WBC 6.6 3.4 - 10.8 x10E3/uL   RBC 4.30 3.77 - 5.28 x10E6/uL   Hemoglobin 12.9 11.1 - 15.9 g/dL   Hematocrit 37.1 34.0 - 46.6 %   MCV 86 79 - 97 fL   MCH 30.0 26.6 - 33.0 pg   MCHC 34.8 31.5 - 35.7 g/dL   RDW 13.2 11.7 - 15.4 %   Platelets 288 150 - 450 x10E3/uL   Neutrophils 49 Not Estab. %   Lymphs 41 Not Estab. %   Monocytes 5 Not Estab. %   Eos 4 Not Estab. %   Basos 1 Not Estab. %    Neutrophils Absolute 3.3 1.4 - 7.0 x10E3/uL   Lymphocytes Absolute 2.7 0.7 - 3.1 x10E3/uL   Monocytes Absolute 0.3 0.1 - 0.9 x10E3/uL   EOS (ABSOLUTE) 0.2 0.0 - 0.4 x10E3/uL   Basophils Absolute 0.1 0.0 - 0.2 x10E3/uL   Immature Granulocytes 0 Not Estab. %   Immature Grans (Abs) 0.0 0.0 - 0.1 x10E3/uL  Comprehensive metabolic panel  Result Value Ref Range   Glucose 98 70 - 99 mg/dL   BUN 13 8 - 27 mg/dL   Creatinine, Ser 0.98 0.57 - 1.00 mg/dL   eGFR 64 >59 mL/min/1.73   BUN/Creatinine Ratio 13 12 - 28   Sodium 142 134 - 144 mmol/L   Potassium 4.4 3.5 - 5.2 mmol/L   Chloride 103 96 - 106 mmol/L   CO2 25 20 - 29 mmol/L   Calcium 9.3 8.7 - 10.3 mg/dL   Total Protein 6.8 6.0 - 8.5 g/dL   Albumin 4.6 3.8 - 4.8 g/dL   Globulin, Total 2.2 1.5 - 4.5 g/dL   Albumin/Globulin Ratio 2.1 1.2 - 2.2   Bilirubin Total 0.3 0.0 - 1.2 mg/dL   Alkaline Phosphatase 110 44 - 121 IU/L   AST 23 0 - 40 IU/L   ALT 14 0 - 32 IU/L  Lipid panel  Result Value Ref Range   Cholesterol, Total 213 (H) 100 - 199 mg/dL   Triglycerides 177 (H) 0 - 149 mg/dL   HDL 64 >39 mg/dL   VLDL Cholesterol Cal 31 5 - 40 mg/dL   LDL Chol Calc (NIH) 118 (H) 0 - 99 mg/dL   Chol/HDL Ratio 3.3 0.0 - 4.4 ratio  TSH  Result Value Ref Range   TSH 2.230 0.450 - 4.500 uIU/mL  Urinalysis, Routine w reflex microscopic  Result Value Ref Range   Specific Gravity, UA 1.025 1.005 - 1.030   pH, UA 5.5 5.0 - 7.5   Color, UA Yellow Yellow   Appearance Ur Clear Clear   Leukocytes,UA 1+ (A) Negative   Protein,UA Negative Negative/Trace   Glucose, UA Negative Negative   Ketones, UA Negative Negative   RBC, UA Negative Negative   Bilirubin, UA Negative Negative  Urobilinogen, Ur 0.2 0.2 - 1.0 mg/dL   Nitrite, UA Negative Negative   Microscopic Examination See below:   HgB A1c  Result Value Ref Range   Hgb A1c MFr Bld 5.4 4.8 - 5.6 %   Est. average glucose Bld gHb Est-mCnc 108 mg/dL  T4, free  Result Value Ref Range   Free T4  1.33 0.82 - 1.77 ng/dL      Assessment & Plan:   Problem List Items Addressed This Visit   None Visit Diagnoses     Right hip pain    -  Primary   Will obtain xray.  Likely related to muscle tightness. Will refer to physical therapy for further evaluationa and treatment. Follow up if not improved.   Relevant Orders   Ambulatory referral to Physical Therapy   DG Hip Unilat W OR W/O Pelvis 2-3 Views Right        Follow up plan: Return if symptoms worsen or fail to improve.

## 2022-08-21 NOTE — Progress Notes (Signed)
Xray showed joint space narrowing.  I recommend the physical therapy to see if symptoms improve.

## 2022-08-24 ENCOUNTER — Telehealth: Payer: Self-pay | Admitting: Nurse Practitioner

## 2022-08-24 NOTE — Telephone Encounter (Signed)
Please let patient know that her referral was sent to [place].

## 2022-08-24 NOTE — Telephone Encounter (Signed)
Copied from [place] [id number]. Topic: Referral - Request for Referral >> Aug 24, 2022  9:59 AM Leilani Able wrote: Has patient seen PCP for this complaint? Yes.   *If NO, is insurance requiring patient see PCP for this issue before PCP can refer them? na Referral for which specialty: P Therapy Preferred provider/office: [facility]  Reason for referral: by provider/ referred to [facility] but cannot be seen till Oct 17th She is also out of town at that time. Pls referral  anytime before the 14th but asap as having a hard time walking and doing her job/ Fu [phone]

## 2022-08-24 NOTE — Telephone Encounter (Signed)
LVM for patient

## 2022-08-25 ENCOUNTER — Telehealth: Payer: Self-pay

## 2022-08-25 ENCOUNTER — Institutional Professional Consult (permissible substitution): Payer: Self-pay | Admitting: Internal Medicine

## 2022-08-25 DIAGNOSIS — M25551 Pain in right hip: Secondary | ICD-10-CM | POA: Diagnosis not present

## 2022-08-25 MED ORDER — LOSARTAN POTASSIUM 25 MG PO TABS: 25.0000 mg | ORAL_TABLET | Freq: Every day | ORAL | 1 refills | Status: DC

## 2022-08-25 NOTE — Telephone Encounter (Signed)
Med refill request for Losartan 25 MG, last filled 07/25/2022 #90. Last seen 08/19/2022. No upcoming appt scheduled, please advise.

## 2022-08-25 NOTE — Telephone Encounter (Signed)
Refill sent to the pharmacy 

## 2022-08-25 NOTE — Telephone Encounter (Signed)
Patient has voiced understanding.  

## 2022-08-26 ENCOUNTER — Ambulatory Visit (INDEPENDENT_AMBULATORY_CARE_PROVIDER_SITE_OTHER): Payer: Medicare HMO

## 2022-08-26 ENCOUNTER — Encounter: Payer: Self-pay | Admitting: Internal Medicine

## 2022-08-26 ENCOUNTER — Ambulatory Visit: Payer: Medicare HMO | Admitting: Internal Medicine

## 2022-08-26 DIAGNOSIS — R058 Other specified cough: Secondary | ICD-10-CM

## 2022-08-26 DIAGNOSIS — R059 Cough, unspecified: Secondary | ICD-10-CM | POA: Diagnosis not present

## 2022-08-26 DIAGNOSIS — F3181 Bipolar II disorder: Secondary | ICD-10-CM | POA: Diagnosis not present

## 2022-08-26 MED ORDER — AMOXICILLIN-POT CLAVULANATE 875-125 MG PO TABS: 1.0000 | ORAL_TABLET | Freq: Two times a day (BID) | ORAL | 0 refills | Status: DC

## 2022-08-26 MED ORDER — PREDNISONE 10 MG PO TABS: ORAL_TABLET | ORAL | 0 refills | Status: DC

## 2022-08-26 MED ORDER — FAMOTIDINE 20 MG PO TABS: ORAL_TABLET | ORAL | 11 refills | Status: DC

## 2022-08-26 MED ORDER — PANTOPRAZOLE SODIUM 40 MG PO TBEC: 40.0000 mg | DELAYED_RELEASE_TABLET | Freq: Every day | ORAL | 2 refills | Status: DC

## 2022-08-26 NOTE — Progress Notes (Signed)
Shelby Johns, female    DOB: 1956/01/07   MRN: [id number]   Brief patient profile:  59 yowf lots of smoke exp   but never smoker with lots ear infections  as child then sinus problems in 20s then mold exp in late 30's "walking pna" with cover ever since  referred to pulmonary clinic 08/26/2022 by Shelby Billings NP  for cough daily extreme sensitivity to "plastics"     Shelby Johns eval in 2021 allergy:  neg/ lisinopril made it a lot worse/ [facility] did MCT then referred to ENT/Shelby Johns eval 04/29/22 c/w irritable larynx syndrome > rec ST   History of Present Illness  08/26/2022  Pulmonary/ 1st office eval/Shelby Johns  Chief Complaint  Patient presents with   Consult    Cough x 15 yrs. Foamy, clear.sob with exertion, occass. wheezing  Dyspnea:  does ok at [place] at fast pace, can't do hills Cough: to point gagging but no vomit does not wake up coughing /mint toothpaste or a/c blowing /perfume constant of pnds / then coughs several times then good / occ green mucus Sleep: fine flat/ one pillow SABA use: none   No obvious day to day or daytime pattern/variability or assoc excess/ purulent sputum or mucus plugs or hemoptysis or cp or chest tightness, subjective wheeze or overt  hb symptoms.   Sleeping  without nocturnal  or early am exacerbation  of respiratory  c/o's or need for noct saba. Also denies any obvious fluctuation of symptoms with weather or environmental changes or other aggravating or alleviating factors except as outlined above   No unusual exposure hx or h/o childhood pna/ asthma or knowledge of premature birth.  Current Allergies, Complete Past Medical History, Past Surgical History, Family History, and Social History were reviewed in [employer] record.  ROS  The following are not active complaints unless bolded Hoarseness, sore throat= globus, dysphagia, dental problems, itching, sneezing,  nasal congestion or discharge of excess mucus or purulent secretions, ear  ache,   fever, chills, sweats, unintended wt loss or wt gain, classically pleuritic or exertional cp,  orthopnea pnd or arm/hand swelling  or leg swelling, presyncope, palpitations, abdominal pain, anorexia, nausea, vomiting, diarrhea  or change in bowel habits or change in bladder habits, change in stools or change in urine, dysuria, hematuria,  rash, arthralgias, visual complaints, headache, numbness, weakness or ataxia or problems with walking or coordination,  change in mood or  memory.           Past Medical History:  Diagnosis Date   Allergies    Asthma    Bipolar 2 disorder ([place])    Hypertension    Prediabetes    Thyroid disease     Outpatient Medications Prior to Visit  Medication Sig Dispense Refill   acetaminophen (TYLENOL) 500 MG tablet Take 2 tablets (1,000 mg total) by mouth every 6 (six) hours as needed. 30 tablet 0   gabapentin (NEURONTIN) 100 MG capsule TAKE 1 CAPSULE(100 MG) BY MOUTH AT BEDTIME 90 capsule 1   lamoTRIgine (LAMICTAL) 100 MG tablet Take 1 tablet (100 mg total) by mouth daily. 90 tablet 1   levothyroxine (SYNTHROID) 75 MCG tablet TAKE 1 TABLET(75 MCG) BY MOUTH DAILY 90 tablet 2   losartan (COZAAR) 25 MG tablet Take 1 tablet (25 mg total) by mouth daily. 90 tablet 1   montelukast (SINGULAIR) 10 MG tablet TAKE 1 TABLET(10 MG) BY MOUTH DAILY 90 tablet 2   Cetirizine HCl 10 MG CAPS Take by  mouth. (Patient not taking: Reported on 04/01/2022)     cyclobenzaprine (FLEXERIL) 5 MG tablet Take 1 tablet (5 mg total) by mouth 3 (three) times daily as needed for muscle spasms. (Patient not taking: Reported on 01/12/2022) 30 tablet 0   furosemide (LASIX) 20 MG tablet Take 1 tablet (20 mg total) by mouth every 3 (three) days. (Patient not taking: Reported on 04/01/2022) 45 tablet 1   No facility-administered medications prior to visit.     Objective:     BP 124/78 (BP Location: Left Arm, Cuff Size: Large)   Pulse 74   Temp 98 F (36.7 C) (Temporal)   Ht 5\' 3"  (1.6 m)    Wt 215 lb (97.5 kg)   SpO2 99% Comment: RA  BMI 38.09 kg/m   SpO2: 99 % (RA)  Amb wf freq throat clearing   HEENT : Oropharynx  clear  s pnds     Nasal turbinates slt mucopuruletnt   NECK :  without  apparent JVD/ palpable Nodes/TM    LUNGS: no acc muscle use,  Nl contour chest which is clear to A and P bilaterally without cough on insp or exp maneuvers   CV:  RRR  no s3 or murmur or increase in P2, and no edema   ABD:  soft and nontender with nl inspiratory excursion in the supine position. No bruits or organomegaly appreciated   MS:  Nl gait/ ext warm without deformities Or obvious joint restrictions  calf tenderness, cyanosis or clubbing    SKIN: warm and dry without lesions    NEURO:  alert, approp, nl sensorium with  no motor or cerebellar deficits apparent.     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   These are the same pts (now being labeled as having "irritable larynx syndrome" by some cough  centers) who not infrequently have a history of having failed to tolerate ace inhibitors(as was the case here) ,  dry powder inhalers or biphosphonates or report having atypical/extraesophageal reflux symptoms (like globus as is the case here)  that don't respond to standard doses of PPI  and are easily confused as having aecopd or asthma flares by even experienced allergists/ pulmonologists (myself included).   Of the three most common causes of  Sub-acute / recurrent or chronic cough, only one (GERD)  can actually contribute to/ trigger  the other two (asthma and  post nasal drip syndrome)  and perpetuate the cylce of cough.  While not intuitively obvious, many patients with chronic low grade reflux do not cough until there is a primary insult that disturbs the protective epithelial barrier and exposes sensitive nerve endings.   This is typically viral but can due to PNDS and  either may apply here.   The point is that once this occurs, it is difficult to eliminate the cycle  using anything but a maximally effective acid suppression regimen at least in the short run, accompanied by an appropriate diet to address non acid GERD and control / eliminate the cough itself for at least 3 days with tessalon 200 >>> also so added 6 day taper off  Prednisone starting at 40 mg per day in case of component of Th-2 driven upper or lower airways inflammation (if cough responds short term only to relapse before return while will on full rx for uacs (as above), then  that would point to allergic rhinitis/ asthma or eos bronchitis as alternative dx)   Also rx for possible sinusitis with sinus CT next step           Each maintenance medication was reviewed in detail including emphasizing most importantly the difference between maintenance and prns and under what circumstances the prns are to be triggered using an action plan format where appropriate.  Total time for H and P, chart review, counseling,  and generating  customized AVS unique to this office visit / same day charting  > 45 min with pt new to me          Christinia Gully, MD 08/26/2022

## 2022-08-26 NOTE — Patient Instructions (Addendum)
The key to effective treatment for your cough is eliminating the non-stop cycle of cough you're stuck in long enough to let your airway heal completely and then see if there is anything still making you cough once you stop the cough suppression, but this should take no more than 5 days to figure out  First take delsym two tsp every 12 hours and supplement if needed with   tessalon 200 mg every 4 hours to suppress the urge to cough at all or even clear your throat. Swallowing water or using ice chips/non mint and menthol containing candies (such as lifesavers or sugarless jolly ranchers) are also effective.  You should rest your voice and avoid activities that you know make you cough.    Prednisone 10 mg take  4 each am x 2 days,   2 each am x 2 days,  1 each am x 2 days and stop (this is to eliminate allergies and inflammation from coughing)  Protonix (pantoprazole) Take 30-60 min before first meal of the day and Pepcid 20 mg one bedtime until return   GERD (REFLUX)  is an extremely common cause of respiratory symptoms, many times with no significant heartburn at all.    It can be treated with medication, but also with lifestyle changes including avoidance of late meals, excessive alcohol, smoking cessation, and avoid fatty foods, chocolate, peppermint, colas, red wine, and acidic juices such as orange juice.  NO MINT OR MENTHOL PRODUCTS SO NO COUGH DROPS  USE HARD CANDY INSTEAD (jolley ranchers or Stover's or Lifesavers (all available in sugarless versions) NO OIL BASED VITAMINS - use powdered substitutes.  Augmentin 875 mg take one pill twice daily  X 10 days - take at breakfast and supper with large glass of water.  It would help reduce the usual side effects (diarrhea and yeast infections) if you ate cultured yogurt at lunch.   Please schedule a follow up office visit in 4 weeks, sooner if needed - [place] office      Please remember to go to the  x-ray department  for your tests - we  will call you with the results when they are available

## 2022-08-26 NOTE — Assessment & Plan Note (Addendum)
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   These are the same pts (now being labeled as having "irritable larynx syndrome" by some cough centers) who not infrequently have a history of having failed to tolerate ace inhibitors(as was the case here) ,  dry powder inhalers or biphosphonates or report having atypical/extraesophageal reflux symptoms (like globus as is the case here)  that don't respond to standard doses of PPI  and are easily confused as having aecopd or asthma flares by even experienced allergists/ pulmonologists (myself included).   Of the three most common causes of  Sub-acute / recurrent or chronic cough, only one (GERD)  can actually contribute to/ trigger  the other two (asthma and post nasal drip syndrome)  and perpetuate the cylce of cough.  While not intuitively obvious, many patients with chronic low grade reflux do not cough until there is a primary insult that disturbs the protective epithelial barrier and exposes sensitive nerve endings.   This is typically viral but can due to PNDS and  either may apply here.   The point is that once this occurs, it is difficult to  eliminate the cycle  using anything but a maximally effective acid suppression regimen at least in the short run, accompanied by an appropriate diet to address non acid GERD and control / eliminate the cough itself for at least 3 days with tessalon 200 >>> also so added 6 day taper off  Prednisone starting at 40 mg per day in case of component of Th-2 driven upper or lower airways inflammation (if cough responds short term only to relapse before return while will on full rx for uacs (as above), then  that would point to allergic rhinitis/ asthma or eos bronchitis as alternative dx)   Also rx for possible sinusitis with sinus CT next step           Each maintenance medication was reviewed in detail including emphasizing most importantly the difference between maintenance and prns and under what circumstances the prns are to be triggered using an action plan format where appropriate.  Total time for H and P, chart review, counseling,  and generating customized AVS unique to this office visit / same day charting  > 45 min with pt new to me

## 2022-08-28 ENCOUNTER — Telehealth: Payer: Self-pay | Admitting: Internal Medicine

## 2022-08-28 MED ORDER — BENZONATATE 200 MG PO CAPS: 200.0000 mg | ORAL_CAPSULE | Freq: Three times a day (TID) | ORAL | 1 refills | Status: DC | PRN

## 2022-08-28 NOTE — Telephone Encounter (Signed)
Called patient and verified medication and pharmacy with her. Nothing further needed

## 2022-08-31 NOTE — Progress Notes (Signed)
Called and spoke with patient, advised her of results/recommendations per Dr. Melvyn Novas.  She verbalized understanding.  Nothing further needed.

## 2022-09-02 DIAGNOSIS — M25551 Pain in right hip: Secondary | ICD-10-CM | POA: Diagnosis not present

## 2022-09-04 DIAGNOSIS — M25551 Pain in right hip: Secondary | ICD-10-CM | POA: Diagnosis not present

## 2022-09-28 ENCOUNTER — Telehealth: Payer: Self-pay | Admitting: Nurse Practitioner

## 2022-09-28 NOTE — Telephone Encounter (Signed)
Noted.  If patient wants an MRI she will need to come in for another visit for documentation purposes.

## 2022-09-28 NOTE — Telephone Encounter (Signed)
Copied from [place] [phone]. Topic: General - Other >> Sep 28, 2022 12:12 PM Tiffany B wrote: Patient states physical therapist would like her to have an MRI and unsure who would place the orders. Patient will call physical therapist to obtain additional information and will have PT call PCP.

## 2022-09-29 NOTE — Telephone Encounter (Signed)
Pt scheduled 10/27

## 2022-10-02 ENCOUNTER — Ambulatory Visit: Payer: Medicare HMO | Admitting: Nurse Practitioner

## 2022-10-06 ENCOUNTER — Ambulatory Visit: Payer: Medicare HMO | Admitting: Internal Medicine

## 2022-10-06 ENCOUNTER — Encounter: Payer: Self-pay | Admitting: Internal Medicine

## 2022-10-06 VITALS — BP 134/76 | HR 86 | Temp 97.9°F | Ht 63.0 in | Wt 218.2 lb

## 2022-10-06 DIAGNOSIS — J340 Abscess, furuncle and carbuncle of nose: Secondary | ICD-10-CM | POA: Diagnosis not present

## 2022-10-06 DIAGNOSIS — R058 Other specified cough: Secondary | ICD-10-CM | POA: Diagnosis not present

## 2022-10-06 NOTE — Assessment & Plan Note (Addendum)
Onset was in her 30 s assoc with sinus dz - [facility] eval in 2021 allergy:  neg/ lisinopril made it a lot worse/ [facility] did MCT then referred to  ENT/[facility] eval 04/29/22 c/w irritable larynx syndrome > rec ST seemed to help  - cyclical cough rx 2/83/6629 with tessalon/ augmentin/gerd rx/ pred x 6 days and f/u in 4 weeks in [place] office  - Sinus CT ordered as symptoms of  HA, am congestion no better p 10 days augmentin  Classic cylcical cough triggered by  Upper airway cough syndrome (previously labeled PNDS),  is so named because it's frequently impossible to sort out how much is  CR/sinusitis with freq throat clearing (which can be related to primary GERD)   vs  causing  secondary (" extra esophageal")  GERD from wide swings in gastric pressure that occur with throat clearing, often  promoting self use of mint and menthol lozenges that reduce the lower esophageal sphincter tone and exacerbate the problem further in a cyclical fashion.   These are the same pts (now being labeled as having "irritable larynx syndrome" by some cough centers) who not infrequently have a history of having failed to tolerate ace inhibitors,  dry powder inhalers or biphosphonates or report having atypical/extraesophageal reflux symptoms that don't respond to standard doses of PPI  and are easily confused as having aecopd or asthma flares by even experienced allergists/ pulmonologists (myself included).   Rec: 1st gen H1 blockers per guidelines   Sinus ct vs ent eval depending on insurance Reviewed how she can eliminate cycle by early rx with otcs for rhinitis/ reflux both at the same time  Pulmonary f/u is prn         Each maintenance medication was reviewed in detail including emphasizing most importantly the difference between maintenance and prns and under what circumstances the prns are to be triggered using an action plan format where appropriate.  Total time for H and P, chart review, counseling,   and  generating customized AVS unique to this office visit / same day charting =  30 min final summary f/u ov

## 2022-10-06 NOTE — Patient Instructions (Signed)
For drainage / throat tickle try take CHLORPHENIRAMINE  4 mg  ("Allergy Relief" 4mg   at [facility] should be easiest to find in the blue box usually on bottom shelf)  take one every 4 hours as needed - extremely effective and inexpensive over the counter- may cause drowsiness so start with just a dose or two an hour before bedtime and see how you tolerate it before trying in daytime.   My office will be contacting you by phone for referral to  hospital for Sinus CT   - if you don't hear back from my office within one week please call us back or notify us thru MyChart and we'll address it right away.    If you are satisfied with your treatment plan,  let your doctor know and he/she can either refill your medications or you can return here when your prescription runs out.     If in any way you are not 100% satisfied,  please tell us.  If 100% better, tell your friends!  Pulmonary follow up is as needed

## 2022-10-06 NOTE — Progress Notes (Signed)
Shelby Johns, female    DOB: 1956-07-24   MRN: [id number]   Brief patient profile:  24 yowf lots of smoke exp   but never smoker with lots ear infections  as child then sinus problems in 20s then mold exp in late 30's "walking pna" with cover ever since  referred to pulmonary clinic 08/26/2022 by Jon Billings NP  for cough daily extreme sensitivity to "plastics"     [facility] eval in 2021 allergy:  neg/ lisinopril made it a lot worse/ [facility] did MCT then referred to ENT/[facility] eval 04/29/22 c/w irritable larynx syndrome > rec ST    History of Present Illness  08/26/2022  Pulmonary/ 1st office eval/Shelby Johns  Chief Complaint  Patient presents with   Consult    Cough x 15 yrs. Foamy, clear.sob with exertion, occass. wheezing  Dyspnea:  does ok at [place] at fast pace, can't do hills Cough: to point gagging but no vomit does not wake up coughing /mint toothpaste or a/c blowing /perfume constant of pnds / then coughs several times then good / occ green mucus Sleep: fine flat/ one pillow SABA use: none  Rec First take delsym two tsp every 12 hours and supplement if needed with   tessalon 200 mg every 4 hours to suppress the urge to cough at all or even clear your throat. Swallowing water or using ice chips/non mint and menthol containing candies (such as lifesavers or sugarless jolly ranchers) are also effective.  You should rest your voice and avoid activities that you know make you cough. Prednisone 10 mg take  4 each am x 2 days,   2 each am x 2 days,  1 each am x 2 days and stop  Protonix (pantoprazole) Take 30-60 min before first meal of the day and Pepcid 20 mg one bedtime until return  GERD  diet Augmentin 875 mg take one pill twice daily  X 10 days   Please schedule a follow up office visit in 4 weeks, sooner if needed - [place] office         10/06/2022  f/u ov/Shelby Johns re: recurrent cough since her 24s assoc with sinus symptoms     still some "sinus HA" and throat congestion in am's  but this is her baseline on singulair  only  Chief Complaint  Patient presents with   Follow-up    Much improved. Slight cough with phlem   Dyspnea:  Not limited by breathing from desired activities   Cough: resolved to her satisfaction  Sleeping: flat surface, some am cough  SABA use: none  02: none  Covid status:   vax x 3     No obvious day to day or daytime variability or assoc excess/ purulent sputum or mucus plugs or hemoptysis or cp or chest tightness, subjective wheeze or overt sinus or hb symptoms.    Also denies any obvious fluctuation of symptoms with weather or environmental changes or other aggravating or alleviating factors except as outlined above   No unusual exposure hx or h/o childhood pna/ asthma or knowledge of premature birth.  Current Allergies, Complete Past Medical History, Past Surgical History, Family History, and Social History were reviewed in [employer] record.  ROS  The following are not active complaints unless bolded Hoarseness, sore throat, dysphagia, dental problems, itching, sneezing,  nasal congestion or discharge of excess mucus or purulent secretions, ear ache,   fever, chills, sweats, unintended wt loss or wt gain, classically pleuritic or exertional  cp,  orthopnea pnd or arm/hand swelling  or leg swelling, presyncope, palpitations, abdominal pain, anorexia, nausea, vomiting, diarrhea  or change in bowel habits or change in bladder habits, change in stools or change in urine, dysuria, hematuria,  rash, arthralgias, visual complaints, headache, numbness, weakness or ataxia or problems with walking or coordination,  change in mood or  memory.        Current Meds  Medication Sig   acetaminophen (TYLENOL) 500 MG tablet Take 2 tablets (1,000 mg total) by mouth every 6 (six) hours as needed.   gabapentin (NEURONTIN) 100 MG capsule TAKE 1 CAPSULE(100 MG) BY MOUTH AT BEDTIME   lamoTRIgine (LAMICTAL) 100 MG tablet Take 1 tablet (100  mg total) by mouth daily.   levothyroxine (SYNTHROID) 75 MCG tablet TAKE 1 TABLET(75 MCG) BY MOUTH DAILY   losartan (COZAAR) 25 MG tablet Take 1 tablet (25 mg total) by mouth daily.   montelukast (SINGULAIR) 10 MG tablet TAKE 1 TABLET(10 MG) BY MOUTH DAILY          Past Medical History:  Diagnosis Date   Allergies    Asthma    Bipolar 2 disorder ([facility])    Hypertension    Prediabetes    Thyroid disease        Objective:     Wt Readings from Last 3 Encounters:  10/06/22 218 lb 3.2 oz (99 kg)  08/26/22 215 lb (97.5 kg)  08/19/22 213 lb 3.2 oz (96.7 kg)      Vital signs reviewed  10/06/2022  - Note at rest 02 sats  99% on RA   General appearance:    healthy appearing amb wf nad     HEENT : Oropharynx  clear     Nasal turbinates mod edema   NECK :  without  apparent JVD/ palpable Nodes/TM    LUNGS: no acc muscle use,  Nl contour chest which is clear to A and P bilaterally without cough on insp or exp maneuvers   CV:  RRR  no s3 or murmur or increase in P2, and no edema   ABD:  soft and nontender with nl inspiratory excursion in the supine position. No bruits or organomegaly appreciated   MS:  Nl gait/ ext warm without deformities Or obvious joint restrictions  calf tenderness, cyanosis or clubbing    SKIN: warm and dry without lesions    NEURO:  alert, approp, nl sensorium with  no motor or cerebellar deficits apparent.             Assessment

## 2022-10-08 ENCOUNTER — Telehealth: Payer: Self-pay

## 2022-10-08 ENCOUNTER — Other Ambulatory Visit: Payer: Self-pay | Admitting: Internal Medicine

## 2022-10-08 DIAGNOSIS — R058 Other specified cough: Secondary | ICD-10-CM

## 2022-10-08 NOTE — Telephone Encounter (Signed)
-----   Message from Tanda Rockers, MD sent at 10/08/2022  1:38 PM EDT ----- Regarding: RE: CT Clarfication I don't know how to do that, sending to my nurse ----- Message ----- From: Dawayne Cirri Sent: 10/08/2022   1:27 PM EDT To: Tanda Rockers, MD Subject: RE: CT Clarfication                            I have obtained approval. I just need a new order to reflex CPT Code [phone] CT Maxillofacial without contrast material. Thanks so much:) ----- Message ----- From: Tanda Rockers, MD Sent: 10/08/2022   1:14 PM EDT To: Dawayne Cirri Subject: RE: CT Clarfication                            Great, use upper airway cough syndrome ----- Message ----- From: Dawayne Cirri Sent: 10/08/2022   1:07 PM EDT To: Tanda Rockers, MD Subject: RE: CT Clarfication                            I can get this approved with these diagnosis codes with no problem. R05.8 (ICD-10-CM) - Upper airway cough syndrome J34.0 (ICD-10-CM) - Abscess, furuncle and carbuncle of nose I just wanted to make sure I was authorizing the correct CPT codes to avoid any billing issues.  ----- Message ----- From: Tanda Rockers, MD Sent: 10/08/2022  12:46 PM EDT To: Dawayne Cirri Subject: RE: CT Clarfication                            Yes but what diagnosis is covered by insurance?   Thx! - ----- Message ----- From: Dawayne Cirri Sent: 10/08/2022  12:27 PM EDT To: Tanda Rockers, MD Subject: RE: CT Clarfication                            If you're looking for reasons for the sinus pain or nasal obstruction. Many of the providers I work for orders a [phone] CT Maxillofacial without contrast material. Would you like for me to authorize this code? ----- Message ----- From: Tanda Rockers, MD Sent: 10/08/2022  12:13 PM EDT To: Dawayne Cirri Subject: RE: CT Clarfication                            What code should I use for sinus pain or nasal obstruction that insurance will pay for ?  ----- Message  ----- From: Dawayne Cirri Sent: 10/08/2022   8:52 AM EDT To: Tanda Rockers, MD Subject: CT Clarfication                                Good morning, I am working on [place] and the code in the chart [phone] is for CT Limited Localized follow up study.   Should I authorize code dropped in the chart [phone]? Or [phone] CT Maxillofacial without contrast material?

## 2022-10-09 DIAGNOSIS — M25551 Pain in right hip: Secondary | ICD-10-CM | POA: Diagnosis not present

## 2022-10-14 ENCOUNTER — Ambulatory Visit
Admission: RE | Admit: 2022-10-14 | Discharge: 2022-10-14 | Disposition: A | Discharge status: Home / Self Care | Payer: Medicare HMO | Source: Ambulatory Visit | Attending: Internal Medicine | Admitting: Internal Medicine

## 2022-10-14 DIAGNOSIS — R058 Other specified cough: Secondary | ICD-10-CM | POA: Insufficient documentation

## 2022-10-14 DIAGNOSIS — J341 Cyst and mucocele of nose and nasal sinus: Secondary | ICD-10-CM | POA: Diagnosis not present

## 2022-10-14 DIAGNOSIS — J342 Deviated nasal septum: Secondary | ICD-10-CM | POA: Diagnosis not present

## 2022-10-21 ENCOUNTER — Ambulatory Visit (INDEPENDENT_AMBULATORY_CARE_PROVIDER_SITE_OTHER): Payer: Medicare HMO | Admitting: Nurse Practitioner

## 2022-10-21 ENCOUNTER — Encounter: Payer: Self-pay | Admitting: Nurse Practitioner

## 2022-10-21 VITALS — BP 131/89 | HR 88 | Temp 98.0°F | Wt 209.6 lb

## 2022-10-21 DIAGNOSIS — G629 Polyneuropathy, unspecified: Secondary | ICD-10-CM

## 2022-10-21 DIAGNOSIS — R7303 Prediabetes: Secondary | ICD-10-CM | POA: Diagnosis not present

## 2022-10-21 DIAGNOSIS — I10 Essential (primary) hypertension: Secondary | ICD-10-CM | POA: Diagnosis not present

## 2022-10-21 DIAGNOSIS — E039 Hypothyroidism, unspecified: Secondary | ICD-10-CM

## 2022-10-21 DIAGNOSIS — Z23 Encounter for immunization: Secondary | ICD-10-CM

## 2022-10-21 DIAGNOSIS — M25551 Pain in right hip: Secondary | ICD-10-CM

## 2022-10-21 NOTE — Assessment & Plan Note (Signed)
Last A1C 5.4 at previous visit.  Will obtain A1C today to assess for prediabetes. Will f/u with patient if medication therapy is needed. Encouraged to maintain physical activity and continue to cut excess sugar from diet.

## 2022-10-21 NOTE — Assessment & Plan Note (Signed)
Chronic, stable. BP today meets goal < 140s/90. Pt encouraged to take a log of Bps at home to note trends and see whether BP and headaches experienced are linked to HTN.  Will obtain labs to assess renal function and electrolytes today.

## 2022-10-21 NOTE — Assessment & Plan Note (Addendum)
Chronic.  Pt has been seeing PT for right hip pain without improvement.  Reports feeling less pain and strength in right hip/muscles, however experiencing sharp, pins/needles sensation down her right leg, unable to sleep on right side.  Physical Therapist requested MRI. Placing order for MRI of right hip and referral to neurology.  Xray showed right hip spurring and arthritis.

## 2022-10-21 NOTE — Progress Notes (Signed)
BP 131/89   Pulse 88   Temp 98 F (36.7 C) (Oral)   Wt 95.1 kg   SpO2 98%   BMI 37.13 kg/m    Subjective:    Patient ID: Shelby Johns, female    DOB: 09/04/1956, 66 y.o.   MRN: [id number]  HPI: Shelby Johns is a 66 y.o. female.  NOTE WRITTEN BY DNP STUDENT.  ASSESSMENT AND PLAN OF CARE REVIEWED WITH STUDENT, AGREE WITH ABOVE FINDINGS AND PLAN.   Seen by pulmonology Sept 23, has been taking delsym, abx, due to chronic cough.    Pt has had issues with sleep, unable to sleep on her right hip d/t pain/neuralgia. Sees PT once every other week.  PT was suggesting to get the MRI since the nerve shock sensation down the leg has not improved although muscle strength has improved.  Pain is a 2/10 today.   Elevated BP Chronic for months No new stressors. Has headaches, migraines, interm. But tries to catch them early onset, takes tylenol migraine.   BP Checks:  Rarely, once/week Range: BP: 130s-140s at home.   Denies lightheadedness, dizziness, palpitations, chest pain. Reports swelling in BLE (mainly knees)  Does not take aspirin   Pt also reports swelling and arthritic pain, worse in her hands. Requested furosemide, however educated on its ineffectiveness for arthritic swelling.     Relevant past medical, surgical, family and social history reviewed and updated as indicated. Interim medical history since our last visit reviewed. Allergies and medications reviewed and updated.  Review of Systems  Constitutional:  Negative for activity change, appetite change, chills, fatigue and fever.  Respiratory:  Positive for cough. Negative for chest tightness, shortness of breath and wheezing.   Cardiovascular:  Positive for leg swelling. Negative for chest pain and palpitations.  Neurological:  Positive for numbness. Negative for dizziness, syncope, weakness, light-headedness and headaches.  Psychiatric/Behavioral:  Positive for sleep disturbance. Negative for decreased concentration,  dysphoric mood and self-injury. The patient is not nervous/anxious and is not hyperactive.     Per HPI unless specifically indicated above     Objective:    BP 131/89   Pulse 88   Temp 98 F (36.7 C) (Oral)   Wt 95.1 kg   SpO2 98%   BMI 37.13 kg/m   Wt Readings from Last 3 Encounters:  10/21/22 95.1 kg  10/06/22 99 kg  08/26/22 97.5 kg    Physical Exam Constitutional:      Appearance: Normal appearance.  Cardiovascular:     Rate and Rhythm: Normal rate and regular rhythm.     Pulses: Normal pulses.     Heart sounds: Normal heart sounds. No murmur heard.    No friction rub.  Pulmonary:     Effort: Pulmonary effort is normal. No respiratory distress.     Breath sounds: Normal breath sounds. No wheezing.  Abdominal:     General: Bowel sounds are normal.     Palpations: Abdomen is soft.  Musculoskeletal:     Right lower leg: No edema.     Left lower leg: No edema.  Skin:    General: Skin is warm.     Capillary Refill: Capillary refill takes 2 to 3 seconds.  Neurological:     General: No focal deficit present.     Mental Status: She is alert and oriented to person, place, and time. Mental status is at baseline.  Psychiatric:        Mood and Affect: Mood normal.  Behavior: Behavior normal.        Thought Content: Thought content normal.        Judgment: Judgment normal.     Results for orders placed or performed in visit on 04/01/22  Microscopic Examination   Urine  Result Value Ref Range   WBC, UA 0-5 0 - 5 /hpf   RBC, Urine None seen 0 - 2 /hpf   Epithelial Cells (non renal) None seen 0 - 10 /hpf   Bacteria, UA None seen None seen/Few  CBC with Differential/Platelet  Result Value Ref Range   WBC 6.6 3.4 - 10.8 x10E3/uL   RBC 4.30 3.77 - 5.28 x10E6/uL   Hemoglobin 12.9 11.1 - 15.9 g/dL   Hematocrit 37.1 34.0 - 46.6 %   MCV 86 79 - 97 fL   MCH 30.0 26.6 - 33.0 pg   MCHC 34.8 31.5 - 35.7 g/dL   RDW 13.2 11.7 - 15.4 %   Platelets 288 150 - 450  x10E3/uL   Neutrophils 49 Not Estab. %   Lymphs 41 Not Estab. %   Monocytes 5 Not Estab. %   Eos 4 Not Estab. %   Basos 1 Not Estab. %   Neutrophils Absolute 3.3 1.4 - 7.0 x10E3/uL   Lymphocytes Absolute 2.7 0.7 - 3.1 x10E3/uL   Monocytes Absolute 0.3 0.1 - 0.9 x10E3/uL   EOS (ABSOLUTE) 0.2 0.0 - 0.4 x10E3/uL   Basophils Absolute 0.1 0.0 - 0.2 x10E3/uL   Immature Granulocytes 0 Not Estab. %   Immature Grans (Abs) 0.0 0.0 - 0.1 x10E3/uL  Comprehensive metabolic panel  Result Value Ref Range   Glucose 98 70 - 99 mg/dL   BUN 13 8 - 27 mg/dL   Creatinine, Ser 0.98 0.57 - 1.00 mg/dL   eGFR 64 >59 mL/min/1.73   BUN/Creatinine Ratio 13 12 - 28   Sodium 142 134 - 144 mmol/L   Potassium 4.4 3.5 - 5.2 mmol/L   Chloride 103 96 - 106 mmol/L   CO2 25 20 - 29 mmol/L   Calcium 9.3 8.7 - 10.3 mg/dL   Total Protein 6.8 6.0 - 8.5 g/dL   Albumin 4.6 3.8 - 4.8 g/dL   Globulin, Total 2.2 1.5 - 4.5 g/dL   Albumin/Globulin Ratio 2.1 1.2 - 2.2   Bilirubin Total 0.3 0.0 - 1.2 mg/dL   Alkaline Phosphatase 110 44 - 121 IU/L   AST 23 0 - 40 IU/L   ALT 14 0 - 32 IU/L  Lipid panel  Result Value Ref Range   Cholesterol, Total 213 (H) 100 - 199 mg/dL   Triglycerides 177 (H) 0 - 149 mg/dL   HDL 64 >39 mg/dL   VLDL Cholesterol Cal 31 5 - 40 mg/dL   LDL Chol Calc (NIH) 118 (H) 0 - 99 mg/dL   Chol/HDL Ratio 3.3 0.0 - 4.4 ratio  TSH  Result Value Ref Range   TSH 2.230 0.450 - 4.500 uIU/mL  Urinalysis, Routine w reflex microscopic  Result Value Ref Range   Specific Gravity, UA 1.025 1.005 - 1.030   pH, UA 5.5 5.0 - 7.5   Color, UA Yellow Yellow   Appearance Ur Clear Clear   Leukocytes,UA 1+ (A) Negative   Protein,UA Negative Negative/Trace   Glucose, UA Negative Negative   Ketones, UA Negative Negative   RBC, UA Negative Negative   Bilirubin, UA Negative Negative   Urobilinogen, Ur 0.2 0.2 - 1.0 mg/dL   Nitrite, UA Negative Negative   Microscopic Examination See below:  HgB A1c  Result Value  Ref Range   Hgb A1c MFr Bld 5.4 4.8 - 5.6 %   Est. average glucose Bld gHb Est-mCnc 108 mg/dL  T4, free  Result Value Ref Range   Free T4 1.33 0.82 - 1.77 ng/dL      Assessment & Plan:   Problem List Items Addressed This Visit       Cardiovascular and Mediastinum   HTN, goal below 140/90    Chronic, stable. BP today meets goal < 140s/90. Pt encouraged to take a log of Bps at home to note trends and see whether BP and headaches experienced are linked to HTN.  Will obtain labs to assess renal function and electrolytes today.       Relevant Orders   Comp Met (CMET)   Lipid Profile     Endocrine   Hypothyroidism, acquired    Stable at previous visit. Currently asymptomatic, no medications at this time.  Obtaining TSH/T4 today. Will f/u with pt on results.      Relevant Orders   T4 AND TSH     Other   Prediabetes    Last A1C 5.4 at previous visit.  Will obtain A1C today to assess for prediabetes. Will f/u with patient if medication therapy is needed. Encouraged to maintain physical activity and continue to cut excess sugar from diet.       Relevant Orders   HgB A1c   Right hip pain    Chronic.  Pt has been seeing PT for right hip pain without improvement.  Reports feeling less pain and strength in right hip/muscles, however experiencing sharp, pins/needles sensation down her right leg, unable to sleep on right side.  Physical Therapist requested MRI. Placing order for MRI of right hip and referral to neurology.  Xray showed right hip spurring and arthritis.      Relevant Orders   MR HIP RIGHT WO CONTRAST   Other Visit Diagnoses     Need for immunization against influenza    -  Primary   Influenza vaccine obtained this visit.   Relevant Orders   Flu Vaccine QUAD High Dose(Fluad) (Completed)   Neuropathy       Relevant Orders   Ambulatory referral to Neurology        Follow up plan: Return in about 6 months (around 04/21/2023) for HTN.

## 2022-10-21 NOTE — Assessment & Plan Note (Signed)
Stable at previous visit. Currently asymptomatic, no medications at this time.  Obtaining TSH/T4 today. Will f/u with pt on results.

## 2022-10-22 LAB — COMPREHENSIVE METABOLIC PANEL
ALT: 12 IU/L (ref 0–32)
AST: 24 IU/L (ref 0–40)
Albumin/Globulin Ratio: 2.2 (ref 1.2–2.2)
Albumin: 4.8 g/dL (ref 3.9–4.9)
Alkaline Phosphatase: 107 IU/L (ref 44–121)
BUN/Creatinine Ratio: 12 (ref 12–28)
BUN: 12 mg/dL (ref 8–27)
Bilirubin Total: 0.4 mg/dL (ref 0.0–1.2)
CO2: 23 mmol/L (ref 20–29)
Calcium: 9.6 mg/dL (ref 8.7–10.3)
Chloride: 102 mmol/L (ref 96–106)
Creatinine, Ser: 1 mg/dL (ref 0.57–1.00)
Globulin, Total: 2.2 g/dL (ref 1.5–4.5)
Glucose: 101 mg/dL — ABNORMAL HIGH (ref 70–99)
Potassium: 4.3 mmol/L (ref 3.5–5.2)
Sodium: 138 mmol/L (ref 134–144)
Total Protein: 7 g/dL (ref 6.0–8.5)
eGFR: 63 mL/min/{1.73_m2} (ref >59)

## 2022-10-22 LAB — LIPID PANEL
Chol/HDL Ratio: 2.8 ratio (ref 0.0–4.4)
Cholesterol, Total: 195 mg/dL (ref 100–199)
HDL: 70 mg/dL (ref >39)
LDL Chol Calc (NIH): 112 mg/dL — ABNORMAL HIGH (ref 0–99)
Triglycerides: 70 mg/dL (ref 0–149)
VLDL Cholesterol Cal: 13 mg/dL (ref 5–40)

## 2022-10-22 LAB — HEMOGLOBIN A1C
Est. average glucose Bld gHb Est-mCnc: 108 mg/dL
Hgb A1c MFr Bld: 5.4 % (ref 4.8–5.6)

## 2022-10-22 LAB — T4 AND TSH
T4, Total: 7.3 ug/dL (ref 4.5–12.0)
TSH: 1.5 u[IU]/mL (ref 0.450–4.500)

## 2022-10-22 NOTE — Progress Notes (Signed)
Hi Shelby Johns. It was nice to see you yesterday.  Your lab work looks good.  Your cholesterol improved from prior.  Your thyroid labs are within normal range.  Your A1c is also within normal range.  No concerns at this time. Continue with your current medication regimen.  Follow up as discussed.  Please let me know if you have any questions.

## 2022-10-28 ENCOUNTER — Ambulatory Visit: Admission: RE | Admit: 2022-10-28 | Payer: Medicare HMO | Source: Ambulatory Visit

## 2022-11-06 ENCOUNTER — Other Ambulatory Visit: Payer: Self-pay | Admitting: Internal Medicine

## 2022-11-06 ENCOUNTER — Encounter: Payer: Self-pay | Admitting: Nurse Practitioner

## 2022-11-06 DIAGNOSIS — R058 Other specified cough: Secondary | ICD-10-CM

## 2023-03-31 ENCOUNTER — Other Ambulatory Visit: Payer: Self-pay | Admitting: Nurse Practitioner

## 2023-03-31 NOTE — Telephone Encounter (Signed)
Requested Prescriptions  Pending Prescriptions Disp Refills   levothyroxine (SYNTHROID) 75 MCG tablet [Pharmacy Med Name: LEVOTHYROXINE 0.075MG  ( ) TABS] 30 tablet 0    Sig: TAKE 1 TABLET(75 MCG) BY MOUTH DAILY     Endocrinology:  Hypothyroid Agents Passed - 03/31/2023  7:01 AM      Passed - TSH in normal range and within 360 days    TSH  Date Value Ref Range Status  10/21/2022 1.500 0.450 - 4.500 uIU/mL Final         Passed - Valid encounter within last 12 months    Recent Outpatient Visits           5 months ago Need for immunization against influenza   [place] [facility] Larae Grooms, NP   7 months ago Right hip pain   [place] [facility] Larae Grooms, NP   12 months ago Annual physical exam   [place] [facility] Larae [facility], NP   1 year ago Dizziness   [place] [facility] Larae Grooms, NP   1 year ago Dislocation of temporomandibular joint, initial encounter   [place] [facility] Larae Grooms, NP       Future Appointments             In 3 weeks Larae Grooms, NP [place] [facility]             montelukast (SINGULAIR) 10 MG tablet [Pharmacy Med Name: MONTELUKAST  TABLETS] 90 tablet 1    Sig: TAKE 1 TABLET(10 MG) BY MOUTH DAILY     Pulmonology:  Leukotriene Inhibitors Passed - 03/31/2023  7:01 AM      Passed - Valid encounter within last 12 months    Recent Outpatient Visits           5 months ago Need for immunization against influenza   [place] [facility] Larae Grooms, NP   7 months ago Right hip pain   [place] [facility] Lar[facility] G[facility], NP   12 months ago Annual physical exam   [place] [facility] Larae Grooms, NP   1 year ago Dizziness   [facility], NP   1 year ago Dislocation of temporomandibular joint,  initial encounter   [place] [facility] Larae Grooms, NP       Future Appointments             In 3 weeks Larae Grooms, NP  [facility]             losartan (COZAAR) 25 MG tablet [Pharmacy Med Name: LOSARTAN  TABLETS] 30 tablet 0    Sig: TAKE 1 TABLET(25 MG) BY MOUTH DAILY     Cardiovascular:  Angiotensin Receptor Blockers Passed - 03/31/2023  7:01 AM      Passed - Cr in normal range and within 180 days    Creatinine, Ser  Date Value Ref Range Status  10/21/2022 1.00 0.57 - 1.00 mg/dL Final         Passed - K in normal range and within 180 days    Potassium  Date Value Ref Range Status  10/21/2022 4.3 3.5 - 5.2 mmol/L Final         Passed - Patient is not pregnant      Passed - Last BP in normal range    BP Readings from Last 1 Encounters:  10/21/22  131/89         Passed - Valid encounter within last 6 months    Recent Outpatient Visits           5 months ago Need for immunization against influenza   [place] [facility] Larae G[facility], NP   7 months ago Right hip pain   [place] [facility], [facility]   12 months ago Annual physical exam   [place] [facility] Larae Grooms, NP   1 year ago Dizziness   [place] [facility] Larae Grooms, NP   1 year ago Dislocation of temporomandibular joint, initial encounter   [place] [facility] Larae Grooms, NP       Future Appointments             In 3 weeks Larae Grooms, NP [place] [facility]

## 2023-03-31 NOTE — Telephone Encounter (Signed)
Unable to refill per protocol, Rx request is too soon. Last refill 03/31/23, duplicate request.  Requested Prescriptions  Pending Prescriptions Disp Refills   levothyroxine (SYNTHROID) 75 MCG tablet [Pharmacy Med Name: LEVOTHYROXINE 0.075MG  ( ) TABS] 90 tablet     Sig: TAKE 1 TABLET(75 MCG) BY MOUTH DAILY     Endocrinology:  Hypothyroid Agents Passed - 03/31/2023 12:24 PM      Passed - TSH in normal range and within 360 days    TSH  Date Value Ref Range Status  10/21/2022 1.500 0.450 - 4.500 uIU/mL Final         Passed - Valid encounter within last 12 months    Recent Outpatient Visits           5 months ago Need for immunization against influenza   [place] [facility], NP   7 months ago Right hip pain   [place] [facility] Larae G[facility], NP   12 months ago Annual physical exam   [place] [facility] Larae Grooms, NP   1 year ago Dizziness   [place] [facility] Larae Grooms, NP   1 year ago Dislocation of temporomandibular joint, initial encounter   [place] [facility] Larae Grooms, NP       Future Appointments             In 3 weeks Larae Grooms, NP [place] [facility]             losartan (COZAAR) 25 MG tablet [Pharmacy Med Name: LOSARTAN  TABLETS] 90 tablet     Sig: TAKE 1 TABLET(25 MG) BY MOUTH DAILY     Cardiovascular:  Angiotensin Receptor Blockers Passed - 03/31/2023 12:24 PM      Passed - Cr in normal range and within 180 days    Creatinine, Ser  Date Value Ref Range Status  10/21/2022 1.00 0.57 - 1.00 mg/dL Final         Passed - K in normal range and within 180 days    Potassium  Date Value Ref Range Status  10/21/2022 4.3 3.5 - 5.2 mmol/L Final         Passed - Patient is not pregnant      Passed - Last BP in normal range    BP Readings from Last 1 Encounters:  10/21/22 131/89          Passed - Valid encounter within last 6 months    Recent Outpatient Visits           5 months ago Need for immunization against influenza   [place] [facility] Lar[facility] G[facility], NP   7 months ago Right hip pain   [place] [facility] Lar[facility] G[facility], NP   12 months ago Annual physical exam   [place] [facility] Larae Grooms, NP   1 year ago Dizziness   [place] [facility] Larae Grooms, NP   1 year ago Dislocation of temporomandibular joint, initial encounter   [place] [facility] Larae Grooms, NP       Future Appointments             In 3 weeks Larae Grooms, NP [place] [facility]

## 2023-04-21 ENCOUNTER — Ambulatory Visit: Payer: Medicare HMO | Admitting: Nurse Practitioner

## 2023-04-30 ENCOUNTER — Other Ambulatory Visit: Payer: Self-pay | Admitting: Nurse Practitioner

## 2023-04-30 NOTE — Telephone Encounter (Signed)
Called pt unable to l/m mailbox is full

## 2023-04-30 NOTE — Telephone Encounter (Signed)
Courtesy refill. Patient will need an office visit for further refills. Requested Prescriptions  Pending Prescriptions Disp Refills   levothyroxine (SYNTHROID) 75 MCG tablet [Pharmacy Med Name: LEVOTHYROXINE 0.075MG  ( ) TABS] 90 tablet 1    Sig: TAKE 1 TABLET(75 MCG) BY MOUTH DAILY     Endocrinology:  Hypothyroid Agents Passed - 04/30/2023  7:07 AM      Passed - TSH in normal range and within 360 days    TSH  Date Value Ref Range Status  10/21/2022 1.500 0.450 - 4.500 uIU/mL Final         Passed - Valid encounter within last 12 months    Recent Outpatient Visits           6 months ago Need for immunization against influenza   [place] [facility] Larae Grooms, NP   8 months ago Right hip pain   [place] [facility] Larae Grooms, NP   1 year ago Annual physical exam   [place] [facility] Larae Grooms, NP   1 year ago Dizziness   [place] [facility] Larae Grooms, NP   1 year ago Dislocation of temporomandibular joint, initial encounter   [place] [facility] Larae Grooms, NP               losartan (COZAAR) 25 MG tablet [Pharmacy Med Name: LOSARTAN 25MG  TABLETS] 30 tablet 0    Sig: TAKE 1 TABLET(25 MG) BY MOUTH DAILY     Cardiovascular:  Angiotensin Receptor Blockers Failed - 04/30/2023  7:07 AM      Failed - Cr in normal range and within 180 days    Creatinine, Ser  Date Value Ref Range Status  10/21/2022 1.00 0.57 - 1.00 mg/dL Final         Failed - K in normal range and within 180 days    Potassium  Date Value Ref Range Status  10/21/2022 4.3 3.5 - 5.2 mmol/L Final         Failed - Valid encounter within last 6 months    Recent Outpatient Visits           6 months ago Need for immunization against influenza   [place] [facility] Lar[facility] Grooms, NP   8 months ago Right hip pain   [place] [facility] Larae Grooms, NP   1 year ago Annual physical exam   [place] [facility] Larae Grooms, NP   1 year ago Dizziness   [place] [facility] Larae Grooms, NP   1 year ago Dislocation of temporomandibular joint, initial encounter    [facility] Lar[facility], NP              Passed - Patient is not pregnant      Passed - Last BP in normal range    BP Readings from Last 1 Encounters:  10/21/22 131/89

## 2023-04-30 NOTE — Telephone Encounter (Signed)
Requested Prescriptions  Refused Prescriptions Disp Refills   losartan (COZAAR) 25 MG tablet [Pharmacy Med Name: LOSARTAN 25MG  TABLETS] 90 tablet     Sig: TAKE 1 TABLET(25 MG) BY MOUTH DAILY     Cardiovascular:  Angiotensin Receptor Blockers Failed - 04/30/2023 11:49 AM      Failed - Cr in normal range and within 180 days    Creatinine, Ser  Date Value Ref Range Status  10/21/2022 1.00 0.57 - 1.00 mg/dL Final         Failed - K in normal range and within 180 days    Potassium  Date Value Ref Range Status  10/21/2022 4.3 3.5 - 5.2 mmol/L Final         Failed - Valid encounter within last 6 months    Recent Outpatient Visits           6 months ago Need for immunization against influenza   [place] [facility] Larae Grooms, NP   8 months ago Right hip pain   [place] [facility] Larae Grooms, NP   1 year ago Annual physical exam   [place] [facility] Lar[facility] Grooms, NP   1 year ago Dizziness   [place] [facility] Lar[facility] Grooms, NP   1 year ago Dislocation of temporomandibular joint, initial encounter   [place] [facility] Larae Grooms, NP              Passed - Patient is not pregnant      Passed - Last BP in normal range    BP Readings from Last 1 Encounters:  10/21/22 131/89

## 2023-05-30 ENCOUNTER — Other Ambulatory Visit: Payer: Self-pay | Admitting: Nurse Practitioner

## 2023-05-31 NOTE — Telephone Encounter (Signed)
Requested medications are due for refill today.  Yes   Requested medications are on the active medications list.  yes  Last refill. 04/30/2023 #30 0 rf  Future visit scheduled.   no  Notes to clinic.  Pt is overdue for OV. Courtesy refill already given.    Requested Prescriptions  Pending Prescriptions Disp Refills   losartan (COZAAR) 25 MG tablet [Pharmacy Med Name: LOSARTAN 25MG  TABLETS] 90 tablet     Sig: TAKE 1 TABLET(25 MG) BY MOUTH DAILY     Cardiovascular:  Angiotensin Receptor Blockers Failed - 05/30/2023  7:04 AM      Failed - Cr in normal range and within 180 days    Creatinine, Ser  Date Value Ref Range Status  10/21/2022 1.00 0.57 - 1.00 mg/dL Final         Failed - K in normal range and within 180 days    Potassium  Date Value Ref Range Status  10/21/2022 4.3 3.5 - 5.2 mmol/L Final         Failed - Valid encounter within last 6 months    Recent Outpatient Visits           7 months ago Need for immunization against influenza   [place] [facility], NP   9 months ago Right hip pain   [place] [facility] Larae Grooms, NP   1 year ago Annual physical exam   [place] [facility] [facility] Larae Grooms, NP   1 year ago Dizziness   [place] [facility] Lar[facility] Grooms, NP   1 year ago Dislocation of temporomandibular joint, initial encounter   [place] [facility] Lar[facility] Grooms, NP              Passed - Patient is not pregnant      Passed - Last BP in normal range    BP Readings from Last 1 Encounters:  10/21/22 131/89

## 2023-06-01 NOTE — Telephone Encounter (Signed)
Attempted to reach patient to get scheduled for follow up, LVM to call office back.  Put in CRM.

## 2023-06-01 NOTE — Telephone Encounter (Signed)
Patient is overdue for an appointment, was due for a 6 month f/up in May. Please call to schedule and then route to the provider for refill.

## 2023-06-02 NOTE — Telephone Encounter (Signed)
Attempted to reach patient again, LVM to call office back to schedule follow up to continue receiving refills.  Will try to call again later.  Put in CRM.

## 2023-06-03 NOTE — Telephone Encounter (Signed)
Attempted to reach patient again, LVM to call office back to schedule follow up to continue receiving refills.

## 2023-06-14 ENCOUNTER — Telehealth: Payer: Self-pay | Admitting: Nurse Practitioner

## 2023-06-14 ENCOUNTER — Other Ambulatory Visit: Payer: Self-pay | Admitting: Nurse Practitioner

## 2023-06-14 NOTE — Telephone Encounter (Signed)
Medication Refill - Medication: losartan 25mg     Has the patient contacted their pharmacy? yes (Agent: If no, request that the patient contact the pharmacy for the refill. If patient does not wish to contact the pharmacy document the reason why and proceed with request.) (Agent: If yes, when and what did the pharmacy advise?)contact pcp  Preferred Pharmacy (with phone number or street name):  [id number] - [place], [place] - [address] AT [facility] OF [address] [facility] Phone: [phone]  Fax: [phone]     Has the patient been seen for an appointment in the last year OR does the patient have an upcoming appointment? yes  Agent: Please be advised that RX refills may take up to 3 business days. We ask that you follow-up with your pharmacy.

## 2023-06-15 MED ORDER — LOSARTAN POTASSIUM 25 MG PO TABS: ORAL_TABLET | ORAL | 0 refills | Status: DC

## 2023-06-15 NOTE — Telephone Encounter (Signed)
Requested Prescriptions  Pending Prescriptions Disp Refills   losartan (COZAAR) 25 MG tablet 90 tablet 0    Sig: TAKE 1 TABLET(25 MG) BY MOUTH DAILY     Cardiovascular:  Angiotensin Receptor Blockers Failed - 06/14/2023  1:10 PM      Failed - Cr in normal range and within 180 days    Creatinine, Ser  Date Value Ref Range Status  10/21/2022 1.00 0.57 - 1.00 mg/dL Final         Failed - K in normal range and within 180 days    Potassium  Date Value Ref Range Status  10/21/2022 4.3 3.5 - 5.2 mmol/L Final         Failed - Valid encounter within last 6 months    Recent Outpatient Visits           7 months ago Need for immunization against influenza   [facility] Larae Grooms, NP   10 months ago Right hip pain   [facility] Larae Grooms, NP   1 year ago Annual physical exam   [facility] Larae Grooms, NP   1 year ago Dizziness   [facility] Larae Grooms, NP   1 year ago Dislocation of temporomandibular joint, initial encounter   [facility] Larae Grooms, NP       Future Appointments             In 6 days Dorcas Carrow, DO  [facility]            Passed - Patient is not pregnant      Passed - Last BP in normal range    BP Readings from Last 1 Encounters:  10/21/22 131/89

## 2023-06-21 ENCOUNTER — Ambulatory Visit (INDEPENDENT_AMBULATORY_CARE_PROVIDER_SITE_OTHER): Payer: Medicare HMO | Admitting: Family Medicine

## 2023-06-21 VITALS — BP 132/83 | HR 80 | Temp 97.9°F | Ht 63.0 in | Wt 218.4 lb

## 2023-06-21 DIAGNOSIS — Z1231 Encounter for screening mammogram for malignant neoplasm of breast: Secondary | ICD-10-CM

## 2023-06-21 DIAGNOSIS — I10 Essential (primary) hypertension: Secondary | ICD-10-CM

## 2023-06-21 DIAGNOSIS — Z136 Encounter for screening for cardiovascular disorders: Secondary | ICD-10-CM | POA: Diagnosis not present

## 2023-06-21 DIAGNOSIS — Z1211 Encounter for screening for malignant neoplasm of colon: Secondary | ICD-10-CM

## 2023-06-21 DIAGNOSIS — F319 Bipolar disorder, unspecified: Secondary | ICD-10-CM

## 2023-06-21 DIAGNOSIS — Z1283 Encounter for screening for malignant neoplasm of skin: Secondary | ICD-10-CM

## 2023-06-21 DIAGNOSIS — Z13820 Encounter for screening for osteoporosis: Secondary | ICD-10-CM | POA: Diagnosis not present

## 2023-06-21 DIAGNOSIS — R7303 Prediabetes: Secondary | ICD-10-CM

## 2023-06-21 DIAGNOSIS — E039 Hypothyroidism, unspecified: Secondary | ICD-10-CM

## 2023-06-21 DIAGNOSIS — Z0000 Encounter for general adult medical examination without abnormal findings: Secondary | ICD-10-CM | POA: Diagnosis not present

## 2023-06-21 LAB — URINALYSIS, ROUTINE W REFLEX MICROSCOPIC
Bilirubin, UA: NEGATIVE
Glucose, UA: NEGATIVE
Ketones, UA: NEGATIVE
Leukocytes,UA: NEGATIVE
Nitrite, UA: NEGATIVE
Protein,UA: NEGATIVE
RBC, UA: NEGATIVE
Specific Gravity, UA: <1.005 — ABNORMAL LOW (ref 1.005–1.030)
Urobilinogen, Ur: 0.2 mg/dL (ref 0.2–1.0)
pH, UA: 6 (ref 5.0–7.5)

## 2023-06-21 LAB — BAYER DCA HB A1C WAIVED: HB A1C (BAYER DCA - WAIVED): 5.4 % (ref 4.8–5.6)

## 2023-06-21 LAB — MICROALBUMIN, URINE WAIVED
Creatinine, Urine Waived: 10 mg/dL (ref 10–300)
Microalb, Ur Waived: 10 mg/L (ref 0–19)
Microalb/Creat Ratio: <30 mg/g (ref <30)

## 2023-06-21 MED ORDER — GABAPENTIN 100 MG PO CAPS: 100.0000 mg | ORAL_CAPSULE | Freq: Every day | ORAL | 1 refills | Status: DC

## 2023-06-21 MED ORDER — MONTELUKAST SODIUM 10 MG PO TABS: 10.0000 mg | ORAL_TABLET | Freq: Every day | ORAL | 1 refills | Status: DC

## 2023-06-21 MED ORDER — LAMOTRIGINE 100 MG PO TABS: 100.0000 mg | ORAL_TABLET | Freq: Every day | ORAL | 1 refills | Status: DC

## 2023-06-21 MED ORDER — LOSARTAN POTASSIUM 25 MG PO TABS: ORAL_TABLET | ORAL | 1 refills | Status: DC

## 2023-06-21 NOTE — Progress Notes (Signed)
BP 132/83   Pulse 80   Temp 97.9 F (36.6 C) (Oral)   Ht 5\' 3"  (1.6 m)   Wt 218 lb 6.4 oz (99.1 kg)   SpO2 98%   BMI 38.69 kg/m    Subjective:    Patient ID: Shelby Johns, female    DOB: 03-12-1956, 67 y.o.   MRN: [id number]  HPI: Shelby Johns is a 67 y.o. female presenting on 06/21/2023 for comprehensive medical examination. Current medical complaints include:  Impaired Fasting Glucose HbA1C:  Lab Results  Component Value Date   HGBA1C 5.4 06/21/2023   Duration of elevated blood sugar: chronic Polydipsia: no Polyuria: no Weight change: yes Visual disturbance: no Glucose Monitoring: no Diabetic Education: Not Completed  BIPOLAR Mood status: exacerbated Satisfied with current treatment?: yes Symptom severity: moderate  Duration of current treatment : chronic Side effects: no Medication compliance: good compliance Previous psychiatric medications: lamictal Depressed mood: yes Anxious mood: yes Anhedonia: no Significant weight loss or gain: yes Insomnia: no  Fatigue: yes Feelings of worthlessness or guilt: yes Impaired concentration/indecisiveness: no Suicidal ideations: no Hopelessness: no Crying spells: yes    06/21/2023    3:38 PM 10/21/2022    1:55 PM 08/19/2022    4:04 PM 04/01/2022    3:48 PM 01/12/2022    2:26 PM  Depression screen PHQ 2/9  Decreased Interest 1 0 0 1 1  Down, Depressed, Hopeless 1 1 1 1  0  PHQ - 2 Score 2 1 1 2 1   Altered sleeping 1 2 2 2 2   Tired, decreased energy 1 1 2 1 2   Change in appetite 1 2 0 2 0  Feeling bad or failure about yourself  0 0 0 0 0  Trouble concentrating 0 0 0 1 1  Moving slowly or fidgety/restless 0 0 0 0 0  Suicidal thoughts 0 0 0 0 0  PHQ-9 Score 5 6 5 8 6   Difficult doing work/chores Not difficult at all Very difficult Not difficult at all Somewhat difficult Not difficult at all    HYPERTENSION / HYPERLIPIDEMIA Satisfied with current treatment? yes Duration of hypertension: chronic BP monitoring  frequency: not checking BP medication side effects: no Past BP meds: losartan Duration of hyperlipidemia: chronic Cholesterol medication side effects: N/A Cholesterol supplements: none Past cholesterol medications: none Medication compliance: excellent compliance Aspirin: no Recent stressors: yes Recurrent headaches: no Visual changes: no Palpitations: no Dyspnea: no Chest pain: no Lower extremity edema: no Dizzy/lightheaded: no  HYPOTHYROIDISM Thyroid control status:stable Satisfied with current treatment? yes Medication side effects: no Medication compliance: excellent compliance Recent dose adjustment:no Fatigue: yes Cold intolerance: no Heat intolerance: no Weight gain: yes Weight loss: no Constipation: no Diarrhea/loose stools: no Palpitations: yes Lower extremity edema: yes Anxiety/depressed mood: yes  Menopausal Symptoms: no  Functional Status Survey: Is the patient deaf or have difficulty hearing?: No Does the patient have difficulty seeing, even when wearing glasses/contacts?: No Does the patient have difficulty concentrating, remembering, or making decisions?: Yes Does the patient have difficulty walking or climbing stairs?: No Does the patient have difficulty dressing or bathing?: No Does the patient have difficulty doing errands alone such as visiting a doctor's office or shopping?: No     06/21/2023    4:20 PM 10/21/2022    1:55 PM 08/19/2022    4:04 PM 04/01/2022    3:48 PM 01/12/2022    2:26 PM  Fall Risk   Falls in the past year? 0 0 0 0 0  Number falls  in past yr: 0 0 0 0 0  Injury with Fall? 0 0 0 0 0  Risk for fall due to :  No Fall Risks No Fall Risks No Fall Risks No Fall Risks  Follow up Falls evaluation completed Falls evaluation completed Falls evaluation completed Falls evaluation completed Falls evaluation completed    Depression Screen    06/21/2023    3:38 PM 10/21/2022    1:55 PM 08/19/2022    4:04 PM 04/01/2022    3:48 PM  01/12/2022    2:26 PM  Depression screen PHQ 2/9  Decreased Interest 1 0 0 1 1  Down, Depressed, Hopeless 1 1 1 1  0  PHQ - 2 Score 2 1 1 2 1   Altered sleeping 1 2 2 2 2   Tired, decreased energy 1 1 2 1 2   Change in appetite 1 2 0 2 0  Feeling bad or failure about yourself  0 0 0 0 0  Trouble concentrating 0 0 0 1 1  Moving slowly or fidgety/restless 0 0 0 0 0  Suicidal thoughts 0 0 0 0 0  PHQ-9 Score 5 6 5 8 6   Difficult doing work/chores Not difficult at all Very difficult Not difficult at all Somewhat difficult Not difficult at all   Advanced Directives Does patient have a HCPOA?    no If yes, name and contact information:  Does patient have a living will or MOST form?  no  Past Medical History:  Past Medical History:  Diagnosis Date   Allergies    Asthma    Bipolar 2 disorder (HCC)    Hypertension    Prediabetes    Thyroid disease     Surgical History:  Past Surgical History:  Procedure Laterality Date   BACK SURGERY     BREAST BIOPSY Right 11/26/2020   [place] bx, vision marker, path pending   BUNIONECTOMY Left    NOSE SURGERY     REPLACEMENT TOTAL KNEE Right    TONSILLECTOMY     age 59   WISDOM TOOTH EXTRACTION      Medications:  Current Outpatient Medications on File Prior to Visit  Medication Sig   acetaminophen (TYLENOL) 500 MG tablet Take 2 tablets (1,000 mg total) by mouth every 6 (six) hours as needed.   levothyroxine (SYNTHROID) 75 MCG tablet TAKE 1 TABLET(75 MCG) BY MOUTH DAILY   No current facility-administered medications on file prior to visit.    Allergies:  Allergies  Allergen Reactions   Meperidine Nausea And Vomiting   Blue Dyes (Parenteral) Dermatitis   Lisinopril    Mucinex [Guaifenesin Er] Dermatitis   Nsaids    Petroleum Distillate Dermatitis   Petroleum Ether Dermatitis   Hydrochlorothiazide Itching, Rash and Swelling   Other Rash    4-tert-butylphenol formaldehyde resin: allergic contact dermatitis Paraphenylenediamine: allergic  contact dermatitis Disperse blue: allergic contact dermatitis Dispersed blue dye    Petrolatum Rash    !!!!!ALLERGIC TO ANY SYNTHETIC FIBERS WITH A RASH REACTION!!!! !!!!!ALLERGIC TO ANY SYNTHETIC FIBERS WITH A RASH REACTION!!!!    Tape Rash   White Petrolatum Rash    Social History:  Social History   Socioeconomic History   Marital status: Single    Spouse name: Not on file   Number of children: Not on file   Years of education: Not on file   Highest education level: Bachelor's degree (e.g., BA, AB, BS)  Occupational History   Not on file  Tobacco Use   Smoking status: Never  Smokeless tobacco: Never  Vaping Use   Vaping status: Never Used  Substance and Sexual Activity   Alcohol use: Yes    Alcohol/week: 2.0 standard drinks of alcohol    Types: 2 Glasses of wine per week   Drug use: Never   Sexual activity: Not Currently    Partners: Male  Other Topics Concern   Not on file  Social History Narrative   Not on file   Social Determinants of Health   Financial Resource Strain: Low Risk  (06/21/2023)   Overall Financial Resource Strain (CARDIA)    Difficulty of Paying Living Expenses: Not very hard  Food Insecurity: No Food Insecurity (06/21/2023)   Hunger Vital Sign    Worried About Running Out of Food in the Last Year: Never true    Ran Out of Food in the Last Year: Never true  Transportation Needs: No Transportation Needs (06/21/2023)   PRAPARE - Administrator, Civil Service (Medical): No    Lack of Transportation (Non-Medical): No  Physical Activity: Sufficiently Active (06/21/2023)   Exercise Vital Sign    Days of Exercise per Week: 4 days    Minutes of Exercise per Session: 90 min  Stress: No Stress Concern Present (06/21/2023)   [employer] of Occupational Health - Occupational Stress Questionnaire    Feeling of Stress : Only a little  Social Connections: Moderately Integrated (06/21/2023)   Social Connection and Isolation Panel [NHANES]     Frequency of Communication with Friends and Family: More than three times a week    Frequency of Social Gatherings with Friends and Family: Twice a week    Attends Religious Services: 1 to 4 times per year    Active Member of [employer] or Organizations: Yes    Attends Engineer, structural: More than 4 times per year    Marital Status: Divorced  Catering manager Violence: Not on file   Social History   Tobacco Use  Smoking Status Never  Smokeless Tobacco Never   Social History   Substance and Sexual Activity  Alcohol Use Yes   Alcohol/week: 2.0 standard drinks of alcohol   Types: 2 Glasses of wine per week    Family History:  Family History  Problem Relation Age of Onset   Hypertension Mother    Colon cancer Mother    Alzheimer's disease Mother    Diabetes Father    Hypertension Father    Arrhythmia Father    Cancer Brother    Schizophrenia Maternal Grandmother    Heart failure Maternal Grandfather    Breast cancer Neg Hx     Past medical history, surgical history, medications, allergies, family history and social history reviewed with patient today and changes made to appropriate areas of the chart.   Review of Systems  Constitutional: Negative.   HENT: Negative.    Eyes:  Positive for blurred vision. Negative for double vision, photophobia, pain, discharge and redness.  Respiratory:  Positive for cough. Negative for hemoptysis, sputum production, shortness of breath and wheezing.   Cardiovascular:  Positive for palpitations and leg swelling. Negative for chest pain, orthopnea, claudication and PND.  Gastrointestinal: Negative.   Genitourinary: Negative.   Musculoskeletal:  Positive for joint pain. Negative for back pain, falls, myalgias and neck pain.  Skin: Negative.   Neurological: Negative.   Endo/Heme/Allergies:  Positive for environmental allergies. Negative for polydipsia. Does not bruise/bleed easily.  Psychiatric/Behavioral:  Positive for  depression. Negative for hallucinations, memory loss, substance abuse and  suicidal ideas. The patient is not nervous/anxious and does not have insomnia.     All other ROS negative except what is listed above and in the HPI.      Objective:    BP 132/83   Pulse 80   Temp 97.9 F (36.6 C) (Oral)   Ht 5\' 3"  (1.6 m)   Wt 218 lb 6.4 oz (99.1 kg)   SpO2 98%   BMI 38.69 kg/m   Wt Readings from Last 3 Encounters:  06/21/23 218 lb 6.4 oz (99.1 kg)  10/21/22 209 lb 9.6 oz (95.1 kg)  10/06/22 218 lb 3.2 oz (99 kg)    Hearing Screening   500Hz  1000Hz  2000Hz  4000Hz   Right ear 20 20 20 20   Left ear 25 25 25 25    Vision Screening   Right eye Left eye Both eyes  Without correction   20/20  With correction       Physical Exam Vitals and nursing note reviewed.  Constitutional:      General: She is not in acute distress.    Appearance: Normal appearance. She is not ill-appearing, toxic-appearing or diaphoretic.  HENT:     Head: Normocephalic and atraumatic.     Right Ear: Tympanic membrane, ear canal and external ear normal. There is no impacted cerumen.     Left Ear: Tympanic membrane, ear canal and external ear normal. There is no impacted cerumen.     Nose: Nose normal. No congestion or rhinorrhea.     Mouth/Throat:     Mouth: Mucous membranes are moist.     Pharynx: Oropharynx is clear. No oropharyngeal exudate or posterior oropharyngeal erythema.  Eyes:     General: No scleral icterus.       Right eye: No discharge.        Left eye: No discharge.     Extraocular Movements: Extraocular movements intact.     Conjunctiva/sclera: Conjunctivae normal.     Pupils: Pupils are equal, round, and reactive to light.  Neck:     Vascular: No carotid bruit.  Cardiovascular:     Rate and Rhythm: Normal rate and regular rhythm.     Pulses: Normal pulses.     Heart sounds: No murmur heard.    No friction rub. No gallop.  Pulmonary:     Effort: Pulmonary effort is normal. No respiratory  distress.     Breath sounds: Normal breath sounds. No stridor. No wheezing, rhonchi or rales.  Chest:     Chest wall: No tenderness.  Abdominal:     General: Abdomen is flat. Bowel sounds are normal. There is no distension.     Palpations: Abdomen is soft. There is no mass.     Tenderness: There is no abdominal tenderness. There is no right CVA tenderness, left CVA tenderness, guarding or rebound.     Hernia: No hernia is present.  Genitourinary:    Comments: Breast and pelvic exams deferred with shared decision making Musculoskeletal:        General: No swelling, tenderness, deformity or signs of injury.     Cervical back: Normal range of motion and neck supple. No rigidity. No muscular tenderness.     Right lower leg: No edema.     Left lower leg: No edema.  Lymphadenopathy:     Cervical: No cervical adenopathy.  Skin:    General: Skin is warm and dry.     Capillary Refill: Capillary refill takes less than 2 seconds.     Coloration:  Skin is not jaundiced or pale.     Findings: No bruising, erythema, lesion or rash.  Neurological:     General: No focal deficit present.     Mental Status: She is alert and oriented to person, place, and time. Mental status is at baseline.     Cranial Nerves: No cranial nerve deficit.     Sensory: No sensory deficit.     Motor: No weakness.     Coordination: Coordination normal.     Gait: Gait normal.     Deep Tendon Reflexes: Reflexes normal.  Psychiatric:        Mood and Affect: Mood normal.        Behavior: Behavior normal.        Thought Content: Thought content normal.        Judgment: Judgment normal.        06/21/2023    3:31 PM  6CIT Screen  What Year? 0 points  What month? 0 points  What time? 0 points  Count back from 20 0 points  Months in reverse 0 points  Repeat phrase 0 points  Total Score 0 points    Results for orders placed or performed in visit on 06/21/23  Bayer DCA Hb A1c Waived  Result Value Ref Range   HB A1C  (BAYER DCA - WAIVED) 5.4 4.8 - 5.6 %  Urinalysis, Routine w reflex microscopic  Result Value Ref Range   Specific Gravity, UA <1.005 (L) 1.005 - 1.030   pH, UA 6.0 5.0 - 7.5   Color, UA Yellow Yellow   Appearance Ur Clear Clear   Leukocytes,UA Negative Negative   Protein,UA Negative Negative/Trace   Glucose, UA Negative Negative   Ketones, UA Negative Negative   RBC, UA Negative Negative   Bilirubin, UA Negative Negative   Urobilinogen, Ur 0.2 0.2 - 1.0 mg/dL   Nitrite, UA Negative Negative   Microscopic Examination Comment   Microalbumin, Urine Waived  Result Value Ref Range   Microalb, Ur Waived 10 0 - 19 mg/L   Creatinine, Urine Waived 10 10 - 300 mg/dL   Microalb/Creat Ratio <30 <30 mg/g      Assessment & Plan:   Problem List Items Addressed This Visit       Cardiovascular and Mediastinum   HTN, goal below 140/90    Under good control on current regimen. Continue current regimen. Continue to monitor. Call with any concerns. Refills given. Labs drawn today.        Relevant Medications   losartan (COZAAR) 25 MG tablet   Other Relevant Orders   CBC with Differential/Platelet   Comprehensive metabolic panel   Urinalysis, Routine w reflex microscopic (Completed)   Microalbumin, Urine Waived (Completed)     Endocrine   Hypothyroidism, acquired    Rechecking labs today. Await results. Treat as needed.       Relevant Orders   CBC with Differential/Platelet   Comprehensive metabolic panel   TSH     Other   Bipolar disorder (HCC)    Under good control on current regimen. Continue current regimen. Continue to monitor. Call with any concerns. Refills given. Labs drawn today.       Relevant Orders   CBC with Differential/Platelet   Comprehensive metabolic panel   Lamotrigine level   Prediabetes    Rechecking labs today. Await results. Treat as needed.       Relevant Orders   Bayer DCA Hb A1c Waived (Completed)   CBC with Differential/Platelet  Comprehensive  metabolic panel   Other Visit Diagnoses     Welcome to Medicare preventive visit    -  Primary   Preventative care discussed today as below.   Relevant Orders   EKG 12-Lead (Completed)   Encounter for screening mammogram for malignant neoplasm of breast       Mammogram ordered today.   Relevant Orders   MM 3D SCREENING MAMMOGRAM BILATERAL BREAST   Screening for osteoporosis       DEXA ordered today.   Relevant Orders   DG Bone Density   Screening for skin cancer       Referral to dermatology placed today.   Relevant Orders   Ambulatory referral to Dermatology   Screening for colon cancer       Referral to GI placed today.   Relevant Orders   Ambulatory referral to Gastroenterology   Screening for cardiovascular condition       EKG normal.   Relevant Orders   Lipid Panel w/o Chol/HDL Ratio   EKG 12-Lead (Completed)        Preventative Services:  AAA screening: N/A Health Risk Assessment and Personalized Prevention Plan: Done today Bone Mass Measurements: Ordered today Breast Cancer Screening: Ordered today CVD Screening: Done today Cervical Cancer Screening: Next visit Colon Cancer Screening: Ordered today Depression Screening: Done today Diabetes Screening: Done today Glaucoma Screening: See your eye doctor Hepatitis B vaccine: N/A Hepatitis C screening: Up to date HIV Screening: up to date Flu Vaccine: N/A Lung cancer Screening: N/A Obesity Screening: Done today Pneumonia Vaccines (2): Next visit STI Screening: N/A  Follow up plan: Return before october pap.   LABORATORY TESTING:  - Pap smear: to be done next visit  IMMUNIZATIONS:   - Tdap: Tetanus vaccination status reviewed: last tetanus booster within 10 years. - Influenza: Postponed to flu season - Prevnar: to be done next visit - Zostavax vaccine: Given elsewhere  SCREENING: -Mammogram: Ordered today  - Colonoscopy: Ordered today  - Bone Density: Ordered today   PATIENT COUNSELING:    Advised to take 1 mg of folate supplement per day if capable of pregnancy.   Sexuality: Discussed sexually transmitted diseases, partner selection, use of condoms, avoidance of unintended pregnancy  and contraceptive alternatives.   Advised to avoid cigarette smoking.  I discussed with the patient that most people either abstain from alcohol or drink within safe limits (<=14/week and <=4 drinks/occasion for males, <=7/weeks and <= 3 drinks/occasion for females) and that the risk for alcohol disorders and other health effects rises proportionally with the number of drinks per week and how often a drinker exceeds daily limits.  Discussed cessation/primary prevention of drug use and availability of treatment for abuse.   Diet: Encouraged to adjust caloric intake to maintain  or achieve ideal body weight, to reduce intake of dietary saturated fat and total fat, to limit sodium intake by avoiding high sodium foods and not adding table salt, and to maintain adequate dietary potassium and calcium preferably from fresh fruits, vegetables, and low-fat dairy products.    stressed the importance of regular exercise  Injury prevention: Discussed safety belts, safety helmets, smoke detector, smoking near bedding or upholstery.   Dental health: Discussed importance of regular tooth brushing, flossing, and dental visits.    NEXT PREVENTATIVE PHYSICAL DUE IN 1 YEAR. Return before october pap.

## 2023-06-21 NOTE — Assessment & Plan Note (Signed)
 Under good control on current regimen. Continue current regimen. Continue to monitor. Call with any concerns. Refills given. Labs drawn today.   

## 2023-06-21 NOTE — Assessment & Plan Note (Signed)
Rechecking labs today. Await results. Treat as needed.  °

## 2023-06-21 NOTE — Progress Notes (Signed)
Interpreted by me 06/21/23. NSR at 69bpm. No ST segment changes

## 2023-06-21 NOTE — Patient Instructions (Addendum)
Your mammogram and bone density are scheduled for 08/31/23 at 2:00 pm. They are scheduled at [facility] in [place]. They will be doing the scans back to back this day.   [facility]  Address: [address], [place], [place] [address] Phone: [phone]    Preventative Services:  AAA screening: N/A Health Risk Assessment and Personalized Prevention Plan: Done today Bone Mass Measurements: Ordered today Breast Cancer Screening: Ordered today CVD Screening: Done today Cervical Cancer Screening: Next visit Colon Cancer Screening: Ordered today Depression Screening: Done today Diabetes Screening: Done today Glaucoma Screening: See your eye doctor Hepatitis B vaccine: N/A Hepatitis C screening: Up to date HIV Screening: up to date Flu Vaccine: N/A Lung cancer Screening: N/A Obesity Screening: Done today Pneumonia Vaccines (2): Next visit STI Screening: N/A

## 2023-06-22 LAB — CBC WITH DIFFERENTIAL/PLATELET
Basophils Absolute: 0.1 10*3/uL (ref 0.0–0.2)
Basos: 1 %
EOS (ABSOLUTE): 0.1 10*3/uL (ref 0.0–0.4)
Eos: 3 %
Hematocrit: 36.4 % (ref 34.0–46.6)
Hemoglobin: 12.1 g/dL (ref 11.1–15.9)
Immature Grans (Abs): 0 10*3/uL (ref 0.0–0.1)
Immature Granulocytes: 0 %
Lymphocytes Absolute: 2.4 10*3/uL (ref 0.7–3.1)
Lymphs: 42 %
MCH: 29 pg (ref 26.6–33.0)
MCHC: 33.2 g/dL (ref 31.5–35.7)
MCV: 87 fL (ref 79–97)
Monocytes Absolute: 0.3 10*3/uL (ref 0.1–0.9)
Monocytes: 6 %
Neutrophils Absolute: 2.8 10*3/uL (ref 1.4–7.0)
Neutrophils: 48 %
Platelets: 248 10*3/uL (ref 150–450)
RBC: 4.17 x10E6/uL (ref 3.77–5.28)
RDW: 12.5 % (ref 11.7–15.4)
WBC: 5.7 10*3/uL (ref 3.4–10.8)

## 2023-06-22 LAB — LIPID PANEL W/O CHOL/HDL RATIO
Cholesterol, Total: 199 mg/dL (ref 100–199)
HDL: 65 mg/dL (ref >39)
LDL Chol Calc (NIH): 110 mg/dL — ABNORMAL HIGH (ref 0–99)
Triglycerides: 135 mg/dL (ref 0–149)
VLDL Cholesterol Cal: 24 mg/dL (ref 5–40)

## 2023-06-22 LAB — COMPREHENSIVE METABOLIC PANEL
ALT: 11 IU/L (ref 0–32)
AST: 23 IU/L (ref 0–40)
Albumin: 4.4 g/dL (ref 3.9–4.9)
Alkaline Phosphatase: 107 IU/L (ref 44–121)
BUN/Creatinine Ratio: 14 (ref 12–28)
BUN: 13 mg/dL (ref 8–27)
Bilirubin Total: 0.4 mg/dL (ref 0.0–1.2)
CO2: 23 mmol/L (ref 20–29)
Calcium: 9.1 mg/dL (ref 8.7–10.3)
Chloride: 103 mmol/L (ref 96–106)
Creatinine, Ser: 0.96 mg/dL (ref 0.57–1.00)
Globulin, Total: 2.3 g/dL (ref 1.5–4.5)
Glucose: 76 mg/dL (ref 70–99)
Potassium: 4.2 mmol/L (ref 3.5–5.2)
Sodium: 140 mmol/L (ref 134–144)
Total Protein: 6.7 g/dL (ref 6.0–8.5)
eGFR: 66 mL/min/{1.73_m2} (ref >59)

## 2023-06-22 LAB — LAMOTRIGINE LEVEL: Lamotrigine Lvl: <1 ug/mL — ABNORMAL LOW (ref 2.0–20.0)

## 2023-06-22 LAB — TSH: TSH: 3.79 u[IU]/mL (ref 0.450–4.500)

## 2023-06-24 NOTE — Progress Notes (Signed)
Leave for Dr. Wynetta Emery review upon return.

## 2023-06-29 ENCOUNTER — Telehealth: Payer: Self-pay

## 2023-06-29 ENCOUNTER — Other Ambulatory Visit: Payer: Self-pay

## 2023-06-29 DIAGNOSIS — Z1211 Encounter for screening for malignant neoplasm of colon: Secondary | ICD-10-CM

## 2023-06-29 DIAGNOSIS — Z800 Family history of malignant neoplasm of digestive organs: Secondary | ICD-10-CM

## 2023-06-29 MED ORDER — NA SULFATE-K SULFATE-MG SULF 17.5-3.13-1.6 GM/177ML PO SOLN: 1.0000 | Freq: Once | ORAL | 0 refills | Status: AC

## 2023-06-29 NOTE — Telephone Encounter (Signed)
Gastroenterology Pre-Procedure Review  Request Date: 08/23/23 Requesting Physician: Dr. Servando Snare  PATIENT REVIEW QUESTIONS: The patient responded to the following health history questions as indicated:    1. Are you having any GI issues? no 2. Do you have a personal history of Polyps? no 3. Do you have a family history of Colon Cancer or Polyps? yes (mother colon cancer) 4. Diabetes Mellitus? no 5. Joint replacements in the past 12 months?no 6. Major health problems in the past 3 months?no 7. Any artificial heart valves, MVP, or defibrillator?no    MEDICATIONS & ALLERGIES:    Patient reports the following regarding taking any anticoagulation/antiplatelet therapy:   Plavix, Coumadin, Eliquis, Xarelto, Lovenox, Pradaxa, Brilinta, or Effient? no Aspirin? no  Patient confirms/reports the following medications:  Current Outpatient Medications  Medication Sig Dispense Refill   acetaminophen (TYLENOL) 500 MG tablet Take 2 tablets (1,000 mg total) by mouth every 6 (six) hours as needed. 30 tablet 0   gabapentin (NEURONTIN) 100 MG capsule Take 1 capsule (100 mg total) by mouth at bedtime. 90 capsule 1   lamoTRIgine (LAMICTAL) 100 MG tablet Take 1 tablet (100 mg total) by mouth daily. 90 tablet 1   levothyroxine (SYNTHROID) 75 MCG tablet TAKE 1 TABLET(75 MCG) BY MOUTH DAILY 90 tablet 1   losartan (COZAAR) 25 MG tablet TAKE 1 TABLET(25 MG) BY MOUTH DAILY 90 tablet 1   montelukast (SINGULAIR) 10 MG tablet Take 1 tablet (10 mg total) by mouth at bedtime. 90 tablet 1   No current facility-administered medications for this visit.    Patient confirms/reports the following allergies:  Allergies  Allergen Reactions   Meperidine Nausea And Vomiting   Blue Dyes (Parenteral) Dermatitis   Lisinopril    Mucinex [Guaifenesin Er] Dermatitis   Nsaids    Petroleum Distillate Dermatitis   Petroleum Ether Dermatitis   Hydrochlorothiazide Itching, Rash and Swelling   Other Rash    4-tert-butylphenol  formaldehyde resin: allergic contact dermatitis Paraphenylenediamine: allergic contact dermatitis Disperse blue: allergic contact dermatitis Dispersed blue dye    Petrolatum Rash    !!!!!ALLERGIC TO ANY SYNTHETIC FIBERS WITH A RASH REACTION!!!! !!!!!ALLERGIC TO ANY SYNTHETIC FIBERS WITH A RASH REACTION!!!!    Tape Rash   White Petrolatum Rash    No orders of the defined types were placed in this encounter.   AUTHORIZATION INFORMATION Primary Insurance: 1D#: Group #:  Secondary Insurance: 1D#: Group #:  SCHEDULE INFORMATION: Date: 08/23/23 Time: Location: [facility]

## 2023-07-01 NOTE — Addendum Note (Signed)
Addended by: Dorcas Carrow on: 07/01/2023 05:02 PM   Modules accepted: Orders

## 2023-07-05 ENCOUNTER — Telehealth: Payer: Self-pay | Admitting: Nurse Practitioner

## 2023-07-05 NOTE — Telephone Encounter (Signed)
Dr. Wilfred Curtis office is calling to report that they receied referral and they are not in network with the patient's insurance.  CB- [phone]

## 2023-07-27 ENCOUNTER — Other Ambulatory Visit (HOSPITAL_COMMUNITY)
Admission: RE | Admit: 2023-07-27 | Discharge: 2023-07-27 | Disposition: A | Discharge status: Home / Self Care | Payer: Medicare HMO | Source: Ambulatory Visit | Attending: Family Medicine | Admitting: Family Medicine

## 2023-07-27 ENCOUNTER — Ambulatory Visit (INDEPENDENT_AMBULATORY_CARE_PROVIDER_SITE_OTHER): Payer: Medicare HMO | Admitting: Family Medicine

## 2023-07-27 ENCOUNTER — Encounter: Payer: Self-pay | Admitting: Family Medicine

## 2023-07-27 VITALS — BP 137/85 | HR 81 | Temp 97.6°F | Wt 212.6 lb

## 2023-07-27 DIAGNOSIS — J069 Acute upper respiratory infection, unspecified: Secondary | ICD-10-CM

## 2023-07-27 DIAGNOSIS — R87610 Atypical squamous cells of undetermined significance on cytologic smear of cervix (ASC-US): Secondary | ICD-10-CM | POA: Diagnosis not present

## 2023-07-27 DIAGNOSIS — Z1151 Encounter for screening for human papillomavirus (HPV): Secondary | ICD-10-CM | POA: Diagnosis not present

## 2023-07-27 DIAGNOSIS — Z01411 Encounter for gynecological examination (general) (routine) with abnormal findings: Secondary | ICD-10-CM | POA: Insufficient documentation

## 2023-07-27 DIAGNOSIS — Z124 Encounter for screening for malignant neoplasm of cervix: Secondary | ICD-10-CM | POA: Diagnosis not present

## 2023-07-27 NOTE — Progress Notes (Signed)
BP 137/85   Pulse 81   Temp 97.6 F (36.4 C) (Oral)   Wt 212 lb 9.6 oz (96.4 kg)   SpO2 98%   BMI 37.66 kg/m    Subjective:    Patient ID: Shelby Johns, female    DOB: 09-06-1956, 67 y.o.   MRN: [id number]  HPI: Shelby Johns is a 67 y.o. female  Chief Complaint  Patient presents with   URI   UPPER RESPIRATORY TRACT INFECTION Duration: 3 days Worst symptom: cough and congestion Fever: subjective Cough: yes Shortness of breath: yes Wheezing: no Chest pain: no Chest tightness: no Chest congestion: yes Nasal congestion: yes Runny nose: yes Post nasal drip: yes Sneezing: no Sore throat: yes Swollen glands: no Sinus pressure: no Headache: no Face pain: no Toothache: no Ear pain: yes left Ear pressure: yes left Eyes red/itching:no Eye drainage/crusting: no  Vomiting: no Rash: no Fatigue: no Sick contacts: yes Strep contacts: no  Context: better Recurrent sinusitis: no Relief with OTC cold/cough medications: no  Treatments attempted: decongestant   Relevant past medical, surgical, family and social history reviewed and updated as indicated. Interim medical history since our last visit reviewed. Allergies and medications reviewed and updated.  Review of Systems  Constitutional:  Positive for chills, diaphoresis and fatigue. Negative for activity change, appetite change, fever and unexpected weight change.  HENT:  Positive for congestion, postnasal drip, rhinorrhea and sinus pressure. Negative for dental problem, drooling, ear discharge, ear pain, facial swelling, hearing loss, mouth sores, nosebleeds, sinus pain, sneezing, sore throat, tinnitus, trouble swallowing and voice change.   Eyes: Negative.   Respiratory:  Positive for cough and shortness of breath. Negative for apnea, choking, chest tightness, wheezing and stridor.   Cardiovascular: Negative.   Gastrointestinal: Negative.   Musculoskeletal: Negative.   Skin: Negative.   Psychiatric/Behavioral:  Negative.      Per HPI unless specifically indicated above     Objective:    BP 137/85   Pulse 81   Temp 97.6 F (36.4 C) (Oral)   Wt 212 lb 9.6 oz (96.4 kg)   SpO2 98%   BMI 37.66 kg/m   Wt Readings from Last 3 Encounters:  07/27/23 212 lb 9.6 oz (96.4 kg)  06/21/23 218 lb 6.4 oz (99.1 kg)  10/21/22 209 lb 9.6 oz (95.1 kg)    Physical Exam Vitals and nursing note reviewed.  Constitutional:      General: She is not in acute distress.    Appearance: Normal appearance. She is obese. She is not ill-appearing, toxic-appearing or diaphoretic.  HENT:     Head: Normocephalic and atraumatic.     Right Ear: Tympanic membrane, ear canal and external ear normal. There is no impacted cerumen.     Left Ear: Tympanic membrane, ear canal and external ear normal. There is no impacted cerumen.     Nose: Rhinorrhea present. No congestion.     Mouth/Throat:     Mouth: Mucous membranes are moist.     Pharynx: Oropharynx is clear. No oropharyngeal exudate or posterior oropharyngeal erythema.  Eyes:     General: No scleral icterus.       Right eye: No discharge.        Left eye: No discharge.     Extraocular Movements: Extraocular movements intact.     Conjunctiva/sclera: Conjunctivae normal.     Pupils: Pupils are equal, round, and reactive to light.  Cardiovascular:     Rate and Rhythm: Normal rate and regular rhythm.  Pulses: Normal pulses.     Heart sounds: Normal heart sounds. No murmur heard.    No friction rub. No gallop.  Pulmonary:     Effort: Pulmonary effort is normal. No respiratory distress.     Breath sounds: Normal breath sounds. No stridor. No wheezing, rhonchi or rales.  Chest:     Chest wall: No tenderness.  Genitourinary:    General: Normal vulva.     Labia:        Right: No rash, tenderness, lesion or injury.        Left: No rash, tenderness, lesion or injury.      Vagina: Normal.     Cervix: Normal.     Uterus: Normal.   Musculoskeletal:        General:  Normal range of motion.     Cervical back: Normal range of motion and neck supple.  Skin:    General: Skin is warm and dry.     Capillary Refill: Capillary refill takes less than 2 seconds.     Coloration: Skin is not jaundiced or pale.     Findings: No bruising, erythema, lesion or rash.  Neurological:     General: No focal deficit present.     Mental Status: She is alert and oriented to person, place, and time. Mental status is at baseline.  Psychiatric:        Mood and Affect: Mood normal.        Behavior: Behavior normal.        Thought Content: Thought content normal.        Judgment: Judgment normal.     Results for orders placed or performed in visit on 06/21/23  Bayer DCA Hb A1c Waived  Result Value Ref Range   HB A1C (BAYER DCA - WAIVED) 5.4 4.8 - 5.6 %  CBC with Differential/Platelet  Result Value Ref Range   WBC 5.7 3.4 - 10.8 x10E3/uL   RBC 4.17 3.77 - 5.28 x10E6/uL   Hemoglobin 12.1 11.1 - 15.9 g/dL   Hematocrit 46.9 62.9 - 46.6 %   MCV 87 79 - 97 fL   MCH 29.0 26.6 - 33.0 pg   MCHC 33.2 31.5 - 35.7 g/dL   RDW 52.8 41.3 - 24.4 %   Platelets 248 150 - 450 x10E3/uL   Neutrophils 48 Not Estab. %   Lymphs 42 Not Estab. %   Monocytes 6 Not Estab. %   Eos 3 Not Estab. %   Basos 1 Not Estab. %   Neutrophils Absolute 2.8 1.4 - 7.0 x10E3/uL   Lymphocytes Absolute 2.4 0.7 - 3.1 x10E3/uL   Monocytes Absolute 0.3 0.1 - 0.9 x10E3/uL   EOS (ABSOLUTE) 0.1 0.0 - 0.4 x10E3/uL   Basophils Absolute 0.1 0.0 - 0.2 x10E3/uL   Immature Granulocytes 0 Not Estab. %   Immature Grans (Abs) 0.0 0.0 - 0.1 x10E3/uL  Comprehensive metabolic panel  Result Value Ref Range   Glucose 76 70 - 99 mg/dL   BUN 13 8 - 27 mg/dL   Creatinine, Ser 0.10 0.57 - 1.00 mg/dL   eGFR 66 >27 OZ/DGU/4.40   BUN/Creatinine Ratio 14 12 - 28   Sodium 140 134 - 144 mmol/L   Potassium 4.2 3.5 - 5.2 mmol/L   Chloride 103 96 - 106 mmol/L   CO2 23 20 - 29 mmol/L   Calcium 9.1 8.7 - 10.3 mg/dL   Total  Protein 6.7 6.0 - 8.5 g/dL   Albumin 4.4 3.9 - 4.9 g/dL  Globulin, Total 2.3 1.5 - 4.5 g/dL   Bilirubin Total 0.4 0.0 - 1.2 mg/dL   Alkaline Phosphatase 107 44 - 121 IU/L   AST 23 0 - 40 IU/L   ALT 11 0 - 32 IU/L  Lipid Panel w/o Chol/HDL Ratio  Result Value Ref Range   Cholesterol, Total 199 100 - 199 mg/dL   Triglycerides 742 0 - 149 mg/dL   HDL 65 >59 mg/dL   VLDL Cholesterol Cal 24 5 - 40 mg/dL   LDL Chol Calc (NIH) 563 (H) 0 - 99 mg/dL  Urinalysis, Routine w reflex microscopic  Result Value Ref Range   Specific Gravity, UA <1.005 (L) 1.005 - 1.030   pH, UA 6.0 5.0 - 7.5   Color, UA Yellow Yellow   Appearance Ur Clear Clear   Leukocytes,UA Negative Negative   Protein,UA Negative Negative/Trace   Glucose, UA Negative Negative   Ketones, UA Negative Negative   RBC, UA Negative Negative   Bilirubin, UA Negative Negative   Urobilinogen, Ur 0.2 0.2 - 1.0 mg/dL   Nitrite, UA Negative Negative   Microscopic Examination Comment   TSH  Result Value Ref Range   TSH 3.790 0.450 - 4.500 uIU/mL  Microalbumin, Urine Waived  Result Value Ref Range   Microalb, Ur Waived 10 0 - 19 mg/L   Creatinine, Urine Waived 10 10 - 300 mg/dL   Microalb/Creat Ratio <30 <30 mg/g  Lamotrigine level  Result Value Ref Range   Lamotrigine Lvl <1.0 (L) 2.0 - 20.0 ug/mL      Assessment & Plan:   Problem List Items Addressed This Visit   None Visit Diagnoses     Upper respiratory tract infection, unspecified type    -  Primary   Lungs clear. Continue symptomatic care. Call with any concrens. Continue to monitor.   Screening for cervical cancer       Pap done today. Await results.   Relevant Orders   Cytology - PAP        Follow up plan: Return January with Clydie Braun for 6 month follow up.

## 2023-07-30 LAB — CYTOLOGY - PAP
Comment: NEGATIVE
Diagnosis: UNDETERMINED — AB
High risk HPV: NEGATIVE

## 2023-08-01 ENCOUNTER — Encounter: Payer: Self-pay | Admitting: Family Medicine

## 2023-08-01 DIAGNOSIS — R87610 Atypical squamous cells of undetermined significance on cytologic smear of cervix (ASC-US): Secondary | ICD-10-CM | POA: Insufficient documentation

## 2023-08-16 ENCOUNTER — Ambulatory Visit: Payer: Medicare HMO | Admitting: Family Medicine

## 2023-08-19 ENCOUNTER — Encounter: Payer: Self-pay | Admitting: Gastroenterology

## 2023-08-23 ENCOUNTER — Ambulatory Visit
Admission: RE | Admit: 2023-08-23 | Discharge: 2023-08-23 | Disposition: A | Discharge status: Home / Self Care | Payer: Medicare HMO | Attending: Gastroenterology | Admitting: Gastroenterology

## 2023-08-23 ENCOUNTER — Ambulatory Visit: Payer: Medicare HMO | Admitting: Anesthesiology

## 2023-08-23 ENCOUNTER — Encounter
Admission: RE | Disposition: A | Discharge status: Home / Self Care | Payer: Self-pay | Source: Home / Self Care | Attending: Gastroenterology

## 2023-08-23 ENCOUNTER — Encounter: Payer: Self-pay | Admitting: Gastroenterology

## 2023-08-23 ENCOUNTER — Other Ambulatory Visit: Payer: Self-pay

## 2023-08-23 DIAGNOSIS — Z800 Family history of malignant neoplasm of digestive organs: Secondary | ICD-10-CM | POA: Insufficient documentation

## 2023-08-23 DIAGNOSIS — Z96659 Presence of unspecified artificial knee joint: Secondary | ICD-10-CM | POA: Diagnosis not present

## 2023-08-23 DIAGNOSIS — I10 Essential (primary) hypertension: Secondary | ICD-10-CM | POA: Diagnosis not present

## 2023-08-23 DIAGNOSIS — Z1211 Encounter for screening for malignant neoplasm of colon: Secondary | ICD-10-CM | POA: Insufficient documentation

## 2023-08-23 DIAGNOSIS — K640 First degree hemorrhoids: Secondary | ICD-10-CM | POA: Insufficient documentation

## 2023-08-23 DIAGNOSIS — Z7189 Other specified counseling: Secondary | ICD-10-CM

## 2023-08-23 HISTORY — DX: Dislocation of jaw, unspecified side, initial encounter: S03.00XA

## 2023-08-23 HISTORY — DX: Migraine, unspecified, not intractable, without status migrainosus: G43.909

## 2023-08-23 HISTORY — PX: COLONOSCOPY WITH PROPOFOL: SHX5780

## 2023-08-23 HISTORY — DX: Unspecified osteoarthritis, unspecified site: M19.90

## 2023-08-23 HISTORY — DX: Fibromyalgia: M79.7

## 2023-08-23 HISTORY — DX: Other complications of anesthesia, initial encounter: T88.59XA

## 2023-08-23 HISTORY — DX: Obesity, unspecified: E66.9

## 2023-08-23 HISTORY — DX: Hypothyroidism, unspecified: E03.9

## 2023-08-23 SURGERY — COLONOSCOPY WITH PROPOFOL
Anesthesia: General | Site: Rectum

## 2023-08-23 MED ORDER — GLYCOPYRROLATE 0.2 MG/ML IJ SOLN
INTRAMUSCULAR | Status: DC | PRN
  Administered 2023-08-23: .2 mg via INTRAVENOUS

## 2023-08-23 MED ORDER — STERILE WATER FOR IRRIGATION IR SOLN
Status: DC | PRN
  Administered 2023-08-23: 1

## 2023-08-23 MED ORDER — LIDOCAINE HCL (CARDIAC) PF 100 MG/5ML IV SOSY
PREFILLED_SYRINGE | INTRAVENOUS | Status: DC | PRN
  Administered 2023-08-23: 60 mg via INTRAVENOUS

## 2023-08-23 MED ORDER — ONDANSETRON HCL 4 MG/2ML IJ SOLN
4.0000 mg | Freq: Once | INTRAMUSCULAR | Status: AC
  Administered 2023-08-23: 4 mg via INTRAVENOUS

## 2023-08-23 MED ORDER — PROPOFOL 10 MG/ML IV BOLUS
INTRAVENOUS | Status: DC | PRN
  Administered 2023-08-23: 100 mg via INTRAVENOUS
  Administered 2023-08-23: 40 mg via INTRAVENOUS
  Administered 2023-08-23 (×2): 20 mg via INTRAVENOUS
  Administered 2023-08-23: 40 mg via INTRAVENOUS

## 2023-08-23 MED ORDER — LACTATED RINGERS IV SOLN: INTRAVENOUS | Status: DC

## 2023-08-23 SURGICAL SUPPLY — 21 items

## 2023-08-23 NOTE — Op Note (Signed)
[facility] Gastroenterology Patient Name: Shelby Johns Procedure Date: 08/23/2023 10:24 AM MRN: [id number] Account #: [id number] Date of Birth: 05/09/1956 Admit Type: Outpatient Age: 67 Room: [facility] OR ROOM 01 Gender: Female Note Status: Finalized Instrument Name: [id number] Procedure:             Colonoscopy Indications:           Screening for colorectal malignant neoplasm Providers:             Midge Minium MD, MD Referring MD:          Larae Grooms (Referring MD) Medicines:             Propofol per Anesthesia Complications:         No immediate complications. Procedure:             Pre-Anesthesia Assessment:                        - Prior to the procedure, a History and Physical was                         performed, and patient medications and allergies were                         reviewed. The patient's tolerance of previous                         anesthesia was also reviewed. The risks and benefits                         of the procedure and the sedation options and risks                         were discussed with the patient. All questions were                         answered, and informed consent was obtained. Prior                         Anticoagulants: The patient has taken no anticoagulant                         or antiplatelet agents. ASA Grade Assessment: II - A                         patient with mild systemic disease. After reviewing                         the risks and benefits, the patient was deemed in                         satisfactory condition to undergo the procedure.                        After obtaining informed consent, the colonoscope was                         passed under direct vision. Throughout the procedure,  the patient's blood pressure, pulse, and oxygen                         saturations were monitored continuously. The                         Colonoscope was introduced through the anus and                          advanced to the the cecum, identified by appendiceal                         orifice and ileocecal valve. The colonoscopy was                         performed without difficulty. The patient tolerated                         the procedure well. The quality of the bowel                         preparation was excellent. Findings:      The perianal and digital rectal examinations were normal.      Non-bleeding internal hemorrhoids were found during retroflexion. The       hemorrhoids were Grade I (internal hemorrhoids that do not prolapse). Impression:            - Non-bleeding internal hemorrhoids.                        - No specimens collected. Recommendation:        - Discharge patient to home.                        - Resume previous diet.                        - Continue present medications.                        - Repeat colonoscopy in 10 years for screening                         purposes. Procedure Code(s):     --- Professional ---                        [id number], Colonoscopy, flexible; diagnostic, including                         collection of specimen(s) by brushing or washing, when                         performed (separate procedure) Diagnosis Code(s):     --- Professional ---                        Z12.11, Encounter for screening for malignant neoplasm                         of colon CPT copyright 2022 American Medical Association. All rights reserved. The  codes documented in this report are preliminary and upon coder review may  be revised to meet current compliance requirements. Midge Minium MD, MD 08/23/2023 10:49:09 AM This report has been signed electronically. Number of Addenda: 0 Note Initiated On: 08/23/2023 10:24 AM Scope Withdrawal Time: 0 hours 8 minutes 43 seconds  Total Procedure Duration: 0 hours 12 minutes 53 seconds  Estimated Blood Loss:  Estimated blood loss: none.      [facility]

## 2023-08-23 NOTE — Transfer of Care (Signed)
Immediate Anesthesia Transfer of Care Note  Patient: Shelby Johns  Procedure(s) Performed: COLONOSCOPY WITH PROPOFOL (Rectum)  Patient Location: PACU  Anesthesia Type: General  Level of Consciousness: awake, alert  and patient cooperative  Airway and Oxygen Therapy: Patient Spontanous Breathing and Patient connected to supplemental oxygen  Post-op Assessment: Post-op Vital signs reviewed, Patient's Cardiovascular Status Stable, Respiratory Function Stable, Patent Airway and No signs of Nausea or vomiting  Post-op Vital Signs: Reviewed and stable  Complications: No notable events documented.

## 2023-08-23 NOTE — Anesthesia Postprocedure Evaluation (Signed)
Anesthesia Post Note  Patient: Briyelle Gato  Procedure(s) Performed: COLONOSCOPY WITH PROPOFOL (Rectum)  Patient location during evaluation: PACU Anesthesia Type: General Level of consciousness: awake and alert Pain management: pain level controlled Vital Signs Assessment: post-procedure vital signs reviewed and stable Respiratory status: spontaneous breathing, nonlabored ventilation, respiratory function stable and patient connected to nasal cannula oxygen Cardiovascular status: blood pressure returned to baseline and stable Postop Assessment: no apparent nausea or vomiting Anesthetic complications: no   No notable events documented.   Last Vitals:  Vitals:   08/23/23 1015 08/23/23 1050  BP: 135/82 (!) 153/69  Pulse: 76 (!) 107  Resp: 18 16  Temp: (!) 36.2 C 36.4 C  SpO2: 98% 98%    Last Pain:  Vitals:   08/23/23 1050  TempSrc:   PainSc: Asleep                 Niyam Bisping C Tahmir Kleckner

## 2023-08-23 NOTE — H&P (Signed)
Shelby Minium, MD [facility] [address], [address] [place], [place] [address] Phone: [phone] Fax : [phone]  Primary Care Physician:  Larae Grooms, NP Primary Gastroenterologist:  Dr. Servando Snare  Pre-Procedure History & Physical: HPI:  Shelby Johns is a 67 y.o. female is here for a screening colonoscopy.   Past Medical History:  Diagnosis Date   Allergies    Arthritis    Asthma    Bipolar 2 disorder (HCC)    Fibromyalgia    Hypertension    Hypothyroidism    Migraine headache    with weather changes   Obesity (BMI 30-39.9)    Prediabetes    Thyroid disease    TMJ (dislocation of temporomandibular joint)     Past Surgical History:  Procedure Laterality Date   BACK SURGERY     BREAST BIOPSY Right 11/26/2020   [place] bx, vision marker, path pending   BUNIONECTOMY Left    NOSE SURGERY     REPLACEMENT TOTAL KNEE Right    TONSILLECTOMY     age 77   WISDOM TOOTH EXTRACTION      Prior to Admission medications   Medication Sig Start Date End Date Taking? Authorizing Provider  acetaminophen (TYLENOL) 500 MG tablet Take 2 tablets (1,000 mg total) by mouth every 6 (six) hours as needed. 02/02/22  Yes Larae Grooms, NP  B Complex Vitamins (B COMPLEX PO) Take by mouth every other day. Alternates with MVI   Yes [provider]  CALCIUM MAGNESIUM ZINC PO Take by mouth every other day. Alternates with MVI   Yes [provider]  lamoTRIgine (LAMICTAL) 100 MG tablet Take 1 tablet (100 mg total) by mouth daily. 06/21/23  Yes Johnson, Megan P, DO  levothyroxine (SYNTHROID) 75 MCG tablet TAKE 1 TABLET(75 MCG) BY MOUTH DAILY 04/30/23  Yes Larae Grooms, NP  losartan (COZAAR) 25 MG tablet TAKE 1 TABLET(25 MG) BY MOUTH DAILY 06/21/23  Yes Johnson, Megan P, DO  montelukast (SINGULAIR) 10 MG tablet Take 1 tablet (10 mg total) by mouth at bedtime. 06/21/23  Yes Johnson, Megan P, DO  Multiple Vitamin (MULTIVITAMIN) tablet Take 1 tablet by mouth every other day. Alternates  with B Complex and Calcium, magnesium, zinc   Yes [provider]  gabapentin (NEURONTIN) 100 MG capsule Take 1 capsule (100 mg total) by mouth at bedtime. Patient not taking: Reported on 08/19/2023 06/21/23   Olevia Perches P, DO    Allergies as of 06/29/2023 - Review Complete 06/29/2023  Allergen Reaction Noted   Meperidine Nausea And Vomiting 12/09/2011   Blue dyes (parenteral) Dermatitis 09/26/2021   Lisinopril  09/26/2021   Mucinex [guaifenesin er] Dermatitis 09/26/2021   Nsaids  09/26/2021   Petroleum distillate Dermatitis 09/26/2021   Petroleum ether Dermatitis 09/26/2021   Hydrochlorothiazide Itching, Rash, and Swelling 08/30/2015   Other Rash 01/04/2012   Petrolatum Rash 08/30/2015   Tape Rash 01/31/2020   White petrolatum Rash 04/24/2014    Family History  Problem Relation Age of Onset   Hypertension Mother    Colon cancer Mother    Alzheimer's disease Mother    Diabetes Father    Hypertension Father    Arrhythmia Father    Cancer Brother    Schizophrenia Maternal Grandmother    Heart failure Maternal Grandfather    Breast cancer Neg Hx     Social History   Socioeconomic History   Marital status: Single    Spouse name: Not on file   Number of children: Not on file  Years of education: Not on file   Highest education level: Bachelor's degree (e.g., BA, AB, BS)  Occupational History   Not on file  Tobacco Use   Smoking status: Never   Smokeless tobacco: Never  Vaping Use   Vaping status: Never Used  Substance and Sexual Activity   Alcohol use: Yes    Alcohol/week: 2.0 standard drinks of alcohol    Types: 2 Glasses of wine per week   Drug use: Never   Sexual activity: Not Currently    Partners: Male  Other Topics Concern   Not on file  Social History Narrative   Not on file   Social Determinants of Health   Financial Resource Strain: Low Risk  (06/21/2023)   Overall Financial Resource Strain (CARDIA)    Difficulty of Paying Living  Expenses: Not very hard  Food Insecurity: No Food Insecurity (06/21/2023)   Hunger Vital Sign    Worried About Running Out of Food in the Last Year: Never true    Ran Out of Food in the Last Year: Never true  Transportation Needs: No Transportation Needs (06/21/2023)   PRAPARE - Administrator, Civil Service (Medical): No    Lack of Transportation (Non-Medical): No  Physical Activity: Sufficiently Active (06/21/2023)   Exercise Vital Sign    Days of Exercise per Week: 4 days    Minutes of Exercise per Session: 90 min  Stress: No Stress Concern Present (06/21/2023)   [employer] of Occupational Health - Occupational Stress Questionnaire    Feeling of Stress : Only a little  Social Connections: Moderately Integrated (06/21/2023)   Social Connection and Isolation Panel [NHANES]    Frequency of Communication with Friends and Family: More than three times a week    Frequency of Social Gatherings with Friends and Family: Twice a week    Attends Religious Services: 1 to 4 times per year    Active Member of [employer] or Organizations: Yes    Attends Engineer, structural: More than 4 times per year    Marital Status: Divorced  Catering manager Violence: Not on file    Review of Systems: See HPI, otherwise negative ROS  Physical Exam: BP 135/82   Pulse 76   Temp (!) 97.2 F (36.2 C) (Temporal)   Resp 18   Ht 5\' 3"  (1.6 m)   Wt 96.2 kg   SpO2 98%   BMI 37.57 kg/m  General:   Alert,  pleasant and cooperative in NAD Head:  Normocephalic and atraumatic. Neck:  Supple; no masses or thyromegaly. Lungs:  Clear throughout to auscultation.    Heart:  Regular rate and rhythm. Abdomen:  Soft, nontender and nondistended. Normal bowel sounds, without guarding, and without rebound.   Neurologic:  Alert and  oriented x4;  grossly normal neurologically.  Impression/Plan: [name] is now here to undergo a screening colonoscopy.  Risks, benefits, and alternatives  regarding colonoscopy have been reviewed with the patient.  Questions have been answered.  All parties agreeable.

## 2023-08-23 NOTE — Anesthesia Preprocedure Evaluation (Addendum)
Anesthesia Evaluation  Patient identified by MRN, date of birth, ID band Patient awake    Reviewed: Allergy & Precautions, H&P , NPO status , Patient's Chart, lab work & pertinent test results  Airway Mallampati: II  TM Distance: >3 FB Neck ROM: Full    Dental  (+) Chipped, Caps Chipped right upper central incisor; cap left upper central incisor and upper tooth on left,not sure, maybe tooth #12 but uncertain. :   Pulmonary asthma    Pulmonary exam normal breath sounds clear to auscultation       Cardiovascular hypertension, Normal cardiovascular exam Rhythm:Regular Rate:Normal     Neuro/Psych  Headaches PSYCHIATRIC DISORDERS  Depression Bipolar Disorder    Neuromuscular disease negative neurological ROS  negative psych ROS   GI/Hepatic negative GI ROS, Neg liver ROS,,,  Endo/Other  Hypothyroidism    Renal/GU negative Renal ROS  negative genitourinary   Musculoskeletal  (+) Arthritis ,  Fibromyalgia -  Abdominal   Peds negative pediatric ROS (+)  Hematology negative hematology ROS (+)   Anesthesia Other Findings Hypertension  Allergies Bipolar 2 disorder   Thyroid disease Prediabetes  Asthma  Migraine headache Arthritis  Hx dislocation TMJ Had total knee replacement, did well for 5 days, then had n/v for a week, but that is NOT an adverse reaction to anesthesia.  A little nauseated today on arrival (from the prep) so will administer zofran 4 mg IV preop    Reproductive/Obstetrics negative OB ROS                             Anesthesia Physical Anesthesia Plan  ASA: 3  Anesthesia Plan: General   Post-op Pain Management:    Induction: Intravenous  PONV Risk Score and Plan:   Airway Management Planned: Natural Airway and Nasal Cannula  Additional Equipment:   Intra-op Plan:   Post-operative Plan:   Informed Consent: I have reviewed the patients History and Physical,  chart, labs and discussed the procedure including the risks, benefits and alternatives for the proposed anesthesia with the patient or authorized representative who has indicated his/her understanding and acceptance.     Dental Advisory Given  Plan Discussed with: Anesthesiologist, CRNA and Surgeon  Anesthesia Plan Comments: (Patient consented for risks of anesthesia including but not limited to:  - adverse reactions to medications - risk of airway placement if required - damage to eyes, teeth, lips or other oral mucosa - nerve damage due to positioning  - sore throat or hoarseness - Damage to heart, brain, nerves, lungs, other parts of body or loss of life  Patient voiced understanding.)        Anesthesia Quick Evaluation

## 2023-08-31 ENCOUNTER — Other Ambulatory Visit: Payer: Medicare HMO

## 2023-09-13 DIAGNOSIS — D2261 Melanocytic nevi of right upper limb, including shoulder: Secondary | ICD-10-CM | POA: Diagnosis not present

## 2023-09-13 DIAGNOSIS — D225 Melanocytic nevi of trunk: Secondary | ICD-10-CM | POA: Diagnosis not present

## 2023-09-13 DIAGNOSIS — D2262 Melanocytic nevi of left upper limb, including shoulder: Secondary | ICD-10-CM | POA: Diagnosis not present

## 2023-09-13 DIAGNOSIS — D2271 Melanocytic nevi of right lower limb, including hip: Secondary | ICD-10-CM | POA: Diagnosis not present

## 2023-09-13 DIAGNOSIS — D2272 Melanocytic nevi of left lower limb, including hip: Secondary | ICD-10-CM | POA: Diagnosis not present

## 2023-09-13 DIAGNOSIS — L570 Actinic keratosis: Secondary | ICD-10-CM | POA: Diagnosis not present

## 2023-09-13 DIAGNOSIS — L821 Other seborrheic keratosis: Secondary | ICD-10-CM | POA: Diagnosis not present

## 2023-09-17 ENCOUNTER — Other Ambulatory Visit: Payer: Self-pay | Admitting: Nurse Practitioner

## 2023-09-17 NOTE — Telephone Encounter (Signed)
Requested Prescriptions  Pending Prescriptions Disp Refills   levothyroxine (SYNTHROID) 75 MCG tablet [Pharmacy Med Name: LEVOTHYROXINE 0.075MG  ( ) TABS] 90 tablet 0    Sig: TAKE 1 TABLET(75 MCG) BY MOUTH DAILY     Endocrinology:  Hypothyroid Agents Passed - 09/17/2023  7:37 AM      Passed - TSH in normal range and within 360 days    TSH  Date Value Ref Range Status  06/21/2023 3.790 0.450 - 4.500 uIU/mL Final         Passed - Valid encounter within last 12 months    Recent Outpatient Visits           1 month ago Upper respiratory tract infection, unspecified type   [place] [place], Megan P, DO   2 months ago [facility] to [employer] preventive visit   [place] [facility] [place], Megan P, DO   11 months ago Need for immunization against influenza   [place] [facility] Larae Grooms, NP   1 year ago Right hip pain   [place] Larae Grooms, NP   1 year ago Annual physical exam   [place] [facility] Larae Grooms, NP       Future Appointments             In 4 months Larae Grooms, NP [place] [facility] [place], [facility]

## 2023-10-05 ENCOUNTER — Ambulatory Visit: Payer: Self-pay
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
## 2023-10-05 NOTE — Telephone Encounter (Signed)
Okay for appointment tomorrow.

## 2023-10-06 ENCOUNTER — Encounter: Payer: Self-pay | Admitting: Nurse Practitioner

## 2023-10-06 ENCOUNTER — Ambulatory Visit (INDEPENDENT_AMBULATORY_CARE_PROVIDER_SITE_OTHER): Payer: Medicare HMO | Admitting: Nurse Practitioner

## 2023-10-06 VITALS — BP 127/82 | HR 61 | Temp 97.8°F | Ht 63.0 in | Wt 216.4 lb

## 2023-10-06 DIAGNOSIS — Z23 Encounter for immunization: Secondary | ICD-10-CM

## 2023-10-06 DIAGNOSIS — R430 Anosmia: Secondary | ICD-10-CM | POA: Diagnosis not present

## 2023-10-06 MED ORDER — TIZANIDINE HCL 4 MG PO TABS
4.0000 mg | ORAL_TABLET | Freq: Four times a day (QID) | ORAL | 0 refills | Status: DC | PRN
Start: 2023-10-06 — End: 2024-03-08

## 2023-10-06 NOTE — Progress Notes (Signed)
BP 127/82   Pulse 61   Temp 97.8 F (36.6 C) (Oral)   Ht 5\' 3"  (1.6 m)   Wt 216 lb 6.4 oz (98.2 kg)   SpO2 98%   BMI 38.33 kg/m    Subjective:    Patient ID: Shelby Johns, female    DOB: 1955/12/28, 67 y.o.   MRN: [id number]  HPI: Shelby Johns is a 67 y.o. female  Chief Complaint  Patient presents with   Pain    Patient states she has noticed some pain in her R collarbone area that radiates into her shoulder and down her arm. States this has been going on for just the last few days. States the area is tender to the touch. Also states she feels increased pain when she moves that arm    Smell Issue    Patient states she has been smelling cigarette smoke off and on for the last 3 to 4 weeks. States when she smells this, she is not around anyone who is smoking.    Patient presents to clinic with complaints of smelling smoke on and off for the last 3 to 4 weeks.  Patient states it happens 2-3 times a day 3-4 days per week. Sometimes it lasts 5 minutes and sometimes it lasts 15 minutes.  Cannot find a pattern.  She is not around anyone who is smoking and a lot of time she is by herself.  Denies any disorientation, vision changes, hearing changes.    Her headaches do seem to be more frequent.    Patient states her right arm is more sore.  She is having soreness touch and sometimes having pain that radiates down her right arm.      Relevant past medical, surgical, family and social history reviewed and updated as indicated. Interim medical history since our last visit reviewed. Allergies and medications reviewed and updated.  Review of Systems  HENT:         Smelling smoke  Musculoskeletal:        Right arm pain  Neurological:  Positive for headaches.    Per HPI unless specifically indicated above     Objective:    BP 127/82   Pulse 61   Temp 97.8 F (36.6 C) (Oral)   Ht 5\' 3"  (1.6 m)   Wt 216 lb 6.4 oz (98.2 kg)   SpO2 98%   BMI 38.33 kg/m   Wt Readings from  Last 3 Encounters:  10/06/23 216 lb 6.4 oz (98.2 kg)  08/23/23 212 lb 1.3 oz (96.2 kg)  07/27/23 212 lb 9.6 oz (96.4 kg)    Physical Exam Vitals and nursing note reviewed.  Constitutional:      General: She is not in acute distress.    Appearance: Normal appearance. She is normal weight. She is not ill-appearing, toxic-appearing or diaphoretic.  HENT:     Head: Normocephalic.     Right Ear: External ear normal.     Left Ear: External ear normal.     Nose: Nose normal.     Mouth/Throat:     Mouth: Mucous membranes are moist.     Pharynx: Oropharynx is clear.  Eyes:     General:        Right eye: No discharge.        Left eye: No discharge.     Extraocular Movements: Extraocular movements intact.     Conjunctiva/sclera: Conjunctivae normal.     Pupils: Pupils are equal, round, and reactive to light.  Cardiovascular:     Rate and Rhythm: Normal rate and regular rhythm.     Heart sounds: No murmur heard. Pulmonary:     Effort: Pulmonary effort is normal. No respiratory distress.     Breath sounds: Normal breath sounds. No wheezing or rales.  Musculoskeletal:     Cervical back: Normal range of motion and neck supple.  Skin:    General: Skin is warm and dry.     Capillary Refill: Capillary refill takes less than 2 seconds.  Neurological:     General: No focal deficit present.     Mental Status: She is alert and oriented to person, place, and time. Mental status is at baseline.     Cranial Nerves: Cranial nerves 2-12 are intact.     Sensory: Sensation is intact.     Motor: Motor function is intact.     Coordination: Coordination is intact.     Gait: Gait is intact.  Psychiatric:        Mood and Affect: Mood normal.        Behavior: Behavior normal.        Thought Content: Thought content normal.        Judgment: Judgment normal.     Results for orders placed or performed in visit on 07/27/23  Cytology - PAP  Result Value Ref Range   High risk HPV Negative    Adequacy       Satisfactory for evaluation; transformation zone component PRESENT.   Diagnosis (A)     - Atypical squamous cells of undetermined significance (ASC-US)   Comment Normal Reference Range HPV - Negative       Assessment & Plan:   Problem List Items Addressed This Visit   None Visit Diagnoses     Anosmia    -  Primary   New problem for about 3-4 weeks. Smelling smoke 2-3 times per day 3-4 days per week.  Worsening headaches. Neuro exam unremarkable. STAT MRI ordered.  Referral to Neurology placed.    Relevant Orders   MR Brain W Wo Contrast   Ambulatory referral to Neurology   Need for influenza vaccination       Need for pneumococcal 20-valent conjugate vaccination       Relevant Orders   Pneumococcal conjugate vaccine 20-valent (Prevnar 20) (Completed)        Follow up plan: Return if symptoms worsen or fail to improve.

## 2023-10-08 ENCOUNTER — Ambulatory Visit
Admission: RE | Admit: 2023-10-08 | Discharge: 2023-10-08 | Disposition: A | Discharge status: Home / Self Care | Payer: Medicare HMO | Source: Ambulatory Visit | Attending: Nurse Practitioner | Admitting: Nurse Practitioner

## 2023-10-08 DIAGNOSIS — R430 Anosmia: Secondary | ICD-10-CM | POA: Diagnosis not present

## 2023-10-08 DIAGNOSIS — R519 Headache, unspecified: Secondary | ICD-10-CM | POA: Diagnosis not present

## 2023-10-08 MED ORDER — GADOBUTROL 1 MMOL/ML IV SOLN
10.0000 mL | Freq: Once | INTRAVENOUS | Status: AC | PRN
  Administered 2023-10-08: 10 mL via INTRAVENOUS

## 2023-10-15 ENCOUNTER — Encounter: Payer: Self-pay | Admitting: Nurse Practitioner

## 2023-10-27 ENCOUNTER — Other Ambulatory Visit: Payer: Self-pay | Admitting: Nurse Practitioner

## 2023-10-27 DIAGNOSIS — Z0000 Encounter for general adult medical examination without abnormal findings: Secondary | ICD-10-CM

## 2023-11-01 ENCOUNTER — Other Ambulatory Visit: Payer: Medicare HMO

## 2023-11-19 DIAGNOSIS — Z1231 Encounter for screening mammogram for malignant neoplasm of breast: Secondary | ICD-10-CM

## 2023-11-26 ENCOUNTER — Ambulatory Visit: Payer: Medicare HMO

## 2023-11-29 ENCOUNTER — Ambulatory Visit: Payer: Medicare HMO | Admitting: Neurology

## 2023-12-16 ENCOUNTER — Other Ambulatory Visit: Payer: Self-pay | Admitting: Nurse Practitioner

## 2023-12-16 ENCOUNTER — Other Ambulatory Visit: Payer: Self-pay | Admitting: Family Medicine

## 2023-12-20 NOTE — Telephone Encounter (Signed)
 Requested Prescriptions  Pending Prescriptions Disp Refills   montelukast  (SINGULAIR ) 10 MG tablet [Pharmacy Med Name: MONTELUKAST  10MG  TABLETS] 90 tablet 1    Sig: TAKE 1 TABLET(10 MG) BY MOUTH AT BEDTIME     Pulmonology:  Leukotriene Inhibitors Passed - 12/20/2023 10:41 AM      Passed - Valid encounter within last 12 months    Recent Outpatient Visits           2 months ago Anosmia   [place] [facility] Melvin Pao, NP   4 months ago Upper respiratory tract infection, unspecified type   [place] [facility], Megan P, DO   6 months ago Welcome to [employer] preventive visit   [place] [facility] [place], [place] P, DO   1 year ago Need for immunization against influenza   [place] [facility] Melvin Pao, NP   1 year ago Right hip pain   [place] [facility] Melvin Pao, NP       Future Appointments             In 1 month Melvin Pao, NP [place] [facility], [facility]

## 2023-12-20 NOTE — Telephone Encounter (Signed)
 Requested Prescriptions  Pending Prescriptions Disp Refills   levothyroxine  (SYNTHROID ) 75 MCG tablet [Pharmacy Med Name: LEVOTHYROXINE  0.075MG  ( ) TABS] 90 tablet 0    Sig: TAKE 1 TABLET(75 MCG) BY MOUTH DAILY     Endocrinology:  Hypothyroid Agents Passed - 12/20/2023 10:39 AM      Passed - TSH in normal range and within 360 days    TSH  Date Value Ref Range Status  06/21/2023 3.790 0.450 - 4.500 uIU/mL Final         Passed - Valid encounter within last 12 months    Recent Outpatient Visits           2 months ago Anosmia   [place] [facility] Melvin Pao, NP   4 months ago Upper respiratory tract infection, unspecified type   [place] [facility] [place], Megan P, DO   6 months ago Welcome to [employer] preventive visit   [place] [facility] [place], [place] P, DO   1 year ago Need for immunization against influenza   [place] [facility] Melvin Pao, NP   1 year ago Right hip pain   [place] [facility] Melvin Pao, NP       Future Appointments             In 1 month Melvin Pao, NP [place] [facility], [facility]

## 2024-01-31 ENCOUNTER — Ambulatory Visit: Payer: Medicare HMO | Admitting: Nurse Practitioner

## 2024-01-31 DIAGNOSIS — E7800 Pure hypercholesterolemia, unspecified: Secondary | ICD-10-CM | POA: Insufficient documentation

## 2024-01-31 NOTE — Progress Notes (Deleted)
 There were no vitals taken for this visit.   Subjective:    Patient ID: Shelby Johns, female    DOB: Apr 08, 1956, 68 y.o.   MRN: [id number]  HPI: Shelby Johns is a 68 y.o. female  No chief complaint on file.  BIPOLAR Mood status: exacerbated Satisfied with current treatment?: yes Symptom severity: moderate  Duration of current treatment : chronic Side effects: no Medication compliance: good compliance Previous psychiatric medications: lamictal Depressed mood: yes Anxious mood: yes Anhedonia: no Significant weight loss or gain: yes Insomnia: no  Fatigue: yes Feelings of worthlessness or guilt: yes Impaired concentration/indecisiveness: no Suicidal ideations: no Hopelessness: no Crying spells: yes    06/21/2023    3:38 PM 10/21/2022    1:55 PM 08/19/2022    4:04 PM 04/01/2022    3:48 PM 01/12/2022    2:26 PM  Depression screen PHQ 2/9  Decreased Interest 1 0 0 1 1  Down, Depressed, Hopeless 1 1 1 1  0  PHQ - 2 Score 2 1 1 2 1   Altered sleeping 1 2 2 2 2   Tired, decreased energy 1 1 2 1 2   Change in appetite 1 2 0 2 0  Feeling bad or failure about yourself  0 0 0 0 0  Trouble concentrating 0 0 0 1 1  Moving slowly or fidgety/restless 0 0 0 0 0  Suicidal thoughts 0 0 0 0 0  PHQ-9 Score 5 6 5 8 6   Difficult doing work/chores Not difficult at all Very difficult Not difficult at all Somewhat difficult Not difficult at all    HYPERTENSION / HYPERLIPIDEMIA Satisfied with current treatment? yes Duration of hypertension: chronic BP monitoring frequency: not checking BP medication side effects: no Past BP meds: losartan Duration of hyperlipidemia: chronic Cholesterol medication side effects: N/A Cholesterol supplements: none Past cholesterol medications: none Medication compliance: excellent compliance Aspirin: no Recent stressors: yes Recurrent headaches: no Visual changes: no Palpitations: no Dyspnea: no Chest pain: no Lower extremity edema:  no Dizzy/lightheaded: no  HYPOTHYROIDISM Thyroid control status:stable Satisfied with current treatment? yes Medication side effects: no Medication compliance: excellent compliance Recent dose adjustment:no Fatigue: yes Cold intolerance: no Heat intolerance: no Weight gain: yes Weight loss: no Constipation: no Diarrhea/loose stools: no Palpitations: yes Lower extremity edema: yes Anxiety/depressed mood: yes  Relevant past medical, surgical, family and social history reviewed and updated as indicated. Interim medical history since our last visit reviewed. Allergies and medications reviewed and updated.  Review of Systems  Per HPI unless specifically indicated above     Objective:    There were no vitals taken for this visit.  Wt Readings from Last 3 Encounters:  10/06/23 216 lb 6.4 oz (98.2 kg)  08/23/23 212 lb 1.3 oz (96.2 kg)  07/27/23 212 lb 9.6 oz (96.4 kg)    Physical Exam  Results for orders placed or performed in visit on 07/27/23  Cytology - PAP   Collection Time: 07/27/23  3:50 PM  Result Value Ref Range   High risk HPV Negative    Adequacy      Satisfactory for evaluation; transformation zone component PRESENT.   Diagnosis (A)     - Atypical squamous cells of undetermined significance (ASC-US)   Comment Normal Reference Range HPV - Negative       Assessment & Plan:   Problem List Items Addressed This Visit       Cardiovascular and Mediastinum   HTN, goal below 140/90     Endocrine   Hypothyroidism,  acquired     Other   Prediabetes - Primary     Follow up plan: No follow-ups on file.

## 2024-03-05 ENCOUNTER — Other Ambulatory Visit: Payer: Self-pay | Admitting: Nurse Practitioner

## 2024-03-07 NOTE — Telephone Encounter (Signed)
 Requested medication (s) are due for refill today - yes  Requested medication (s) are on the active medication list -yes  Future visit scheduled -no  Last refill: 10/06/23 #30  Notes to clinic: non delegated Rx  Requested Prescriptions  Pending Prescriptions Disp Refills   tiZANidine (ZANAFLEX) 4 MG tablet [Pharmacy Med Name: TIZANIDINE 4MG  TABLETS] 30 tablet 0    Sig: TAKE 1 TABLET(4 MG) BY MOUTH EVERY 6 HOURS AS NEEDED FOR MUSCLE SPASMS     Not Delegated - Cardiovascular:  Alpha-2 Agonists - tizanidine Failed - 03/07/2024  3:41 PM      Failed - This refill cannot be delegated      Failed - Valid encounter within last 6 months    Recent Outpatient Visits   None               Requested Prescriptions  Pending Prescriptions Disp Refills   tiZANidine (ZANAFLEX) 4 MG tablet [Pharmacy Med Name: TIZANIDINE 4MG  TABLETS] 30 tablet 0    Sig: TAKE 1 TABLET(4 MG) BY MOUTH EVERY 6 HOURS AS NEEDED FOR MUSCLE SPASMS     Not Delegated - Cardiovascular:  Alpha-2 Agonists - tizanidine Failed - 03/07/2024  3:41 PM      Failed - This refill cannot be delegated      Failed - Valid encounter within last 6 months    Recent Outpatient Visits   None

## 2024-03-16 ENCOUNTER — Other Ambulatory Visit: Payer: Self-pay | Admitting: Family Medicine

## 2024-03-17 NOTE — Telephone Encounter (Signed)
 OV needed for additional refills.  Requested Prescriptions  Pending Prescriptions Disp Refills   losartan (COZAAR) 25 MG tablet [Pharmacy Med Name: LOSARTAN 25MG  TABLETS] 30 tablet 0    Sig: TAKE 1 TABLET(25 MG) BY MOUTH DAILY     Cardiovascular:  Angiotensin Receptor Blockers Failed - 03/17/2024  1:09 PM      Failed - Cr in normal range and within 180 days    Creatinine, Ser  Date Value Ref Range Status  06/21/2023 0.96 0.57 - 1.00 mg/dL Final         Failed - K in normal range and within 180 days    Potassium  Date Value Ref Range Status  06/21/2023 4.2 3.5 - 5.2 mmol/L Final         Failed - Valid encounter within last 6 months    Recent Outpatient Visits   None            Passed - Patient is not pregnant      Passed - Last BP in normal range    BP Readings from Last 1 Encounters:  10/06/23 127/82

## 2024-03-17 NOTE — Telephone Encounter (Signed)
 Pt needs an office visit. Last seen 10/06/2023.

## 2024-07-11 ENCOUNTER — Ambulatory Visit (INDEPENDENT_AMBULATORY_CARE_PROVIDER_SITE_OTHER): Admitting: Nurse Practitioner

## 2024-07-11 ENCOUNTER — Encounter: Payer: Self-pay | Admitting: Nurse Practitioner

## 2024-07-11 VITALS — BP 129/80 | HR 68 | Temp 98.7°F | Resp 15 | Ht 62.99 in | Wt 219.4 lb

## 2024-07-11 DIAGNOSIS — E669 Obesity, unspecified: Secondary | ICD-10-CM

## 2024-07-11 DIAGNOSIS — Z0000 Encounter for general adult medical examination without abnormal findings: Secondary | ICD-10-CM

## 2024-07-11 DIAGNOSIS — Z7189 Other specified counseling: Secondary | ICD-10-CM

## 2024-07-11 DIAGNOSIS — F319 Bipolar disorder, unspecified: Secondary | ICD-10-CM

## 2024-07-11 DIAGNOSIS — E7800 Pure hypercholesterolemia, unspecified: Secondary | ICD-10-CM

## 2024-07-11 DIAGNOSIS — Z1231 Encounter for screening mammogram for malignant neoplasm of breast: Secondary | ICD-10-CM

## 2024-07-11 DIAGNOSIS — R7303 Prediabetes: Secondary | ICD-10-CM | POA: Diagnosis not present

## 2024-07-11 DIAGNOSIS — M797 Fibromyalgia: Secondary | ICD-10-CM

## 2024-07-11 DIAGNOSIS — E039 Hypothyroidism, unspecified: Secondary | ICD-10-CM

## 2024-07-11 DIAGNOSIS — Z780 Asymptomatic menopausal state: Secondary | ICD-10-CM

## 2024-07-11 MED ORDER — TIZANIDINE HCL 4 MG PO TABS: 4.0000 mg | ORAL_TABLET | Freq: Three times a day (TID) | ORAL | 0 refills | Status: DC | PRN

## 2024-07-11 MED ORDER — PREGABALIN 25 MG PO CAPS: 25.0000 mg | ORAL_CAPSULE | Freq: Two times a day (BID) | ORAL | 0 refills | Status: DC

## 2024-07-11 NOTE — Assessment & Plan Note (Signed)
 Chronic. Not well controlled.  Feels like it is related more to her worsening pain. Will work to get pain under control. Follow up in 3 months.  Call sooner if concerns arise.

## 2024-07-11 NOTE — Progress Notes (Signed)
 BP 129/80 (BP Location: Right Arm, Patient Position: Sitting, Cuff Size: Large)   Pulse 68   Temp 98.7 F (37.1 C) (Oral)   Resp 15   Ht 5' 2.99 (1.6 m)   Wt 219 lb 6.4 oz (99.5 kg)   SpO2 96%   BMI 38.87 kg/m    Subjective:    Patient ID: Shelby Johns, female    DOB: 12/20/1955, 68 y.o.   MRN: [id number]  HPI: Shelby Johns is a 68 y.o. female presenting on 07/11/2024 for comprehensive medical examination. Current medical complaints include:none  She currently lives with: Menopausal Symptoms: no  BIPOLAR Patient states she is not taking the Lamictal .  She took it for probably 2 months after her visit last July but didn't feel a difference and didn't follow up. She feels like her Fibromyalgia is getting worse and feels like that is worsening her mood. The pain is more prominent.  She has tried Gabapentin  in the past but no relief was seen in the medication.  Mood status: improved Satisfied with current treatment?: yes Symptom severity: moderate  Duration of current treatment : chronic Side effects: no Medication compliance: good compliance Previous psychiatric medications: lamictal  Depressed mood: yes Anxious mood: yes Anhedonia: no Significant weight loss or gain: yes Insomnia: no  Fatigue: yes Feelings of worthlessness or guilt: yes Impaired concentration/indecisiveness: no Suicidal ideations: no Hopelessness: no Crying spells: yes  HYPERTENSION / HYPERLIPIDEMIA She is back taking Losartan  for about the last week. Satisfied with current treatment? yes Duration of hypertension: chronic BP monitoring frequency: not checking BP medication side effects: no Past BP meds: losartan  Duration of hyperlipidemia: chronic Cholesterol medication side effects: N/A Cholesterol supplements: none Past cholesterol medications: none Medication compliance: excellent compliance Aspirin: no Recent stressors: yes Recurrent headaches: yes- but improved with being back on the  losartan  Visual changes: no Palpitations: no Dyspnea: no Chest pain: no Lower extremity edema: no Dizzy/lightheaded: no  HYPOTHYROIDISM Thyroid  control status:stable Satisfied with current treatment? yes Medication side effects: no Medication compliance: excellent compliance Recent dose adjustment:no Fatigue: yes Cold intolerance: no Heat intolerance: yes Weight gain: no Weight loss: no Constipation: no Diarrhea/loose stools: no Palpitations: no Lower extremity edema: yes Anxiety/depressed mood: yes  Depression Screen done today and results listed below:     07/11/2024    1:44 PM 07/27/2023    3:33 PM 06/21/2023    3:38 PM 10/21/2022    1:55 PM 08/19/2022    4:04 PM  Depression screen PHQ 2/9  Decreased Interest 0 1 1 0 0  Down, Depressed, Hopeless 0 1 1 1 1   PHQ - 2 Score 0 2 2 1 1   Altered sleeping  1 1 2 2   Tired, decreased energy  1 1 1 2   Change in appetite  1 1 2  0  Feeling bad or failure about yourself   1 0 0 0  Trouble concentrating  1 0 0 0  Moving slowly or fidgety/restless  1 0 0 0  Suicidal thoughts  0 0 0 0  PHQ-9 Score  8 5 6 5   Difficult doing work/chores  Somewhat difficult Not difficult at all Very difficult Not difficult at all    The patient does not have a history of falls. I did complete a risk assessment for falls. A plan of care for falls was documented.   Past Medical History:  Past Medical History:  Diagnosis Date   Allergies    Arthritis    Asthma    Bipolar 2  disorder (HCC)    Fibromyalgia    Hypertension    Hypothyroidism    Migraine headache    with weather changes   Obesity (BMI 30-39.9)    Prediabetes    Thyroid  disease    TMJ (dislocation of temporomandibular joint)     Surgical History:  Past Surgical History:  Procedure Laterality Date   BACK SURGERY     BREAST BIOPSY Right 11/26/2020   us  bx, vision marker, path pending   BUNIONECTOMY Left    COLONOSCOPY WITH PROPOFOL  N/A 08/23/2023   Procedure: COLONOSCOPY  WITH PROPOFOL ;  Surgeon: Jinny Carmine, MD;  Location: [facility];  Service: Endoscopy;  Laterality: N/A;   NOSE SURGERY     REPLACEMENT TOTAL KNEE Right    TONSILLECTOMY     age 38   WISDOM TOOTH EXTRACTION      Medications:  Current Outpatient Medications on File Prior to Visit  Medication Sig   acetaminophen  (TYLENOL ) 500 MG tablet Take 2 tablets (1,000 mg total) by mouth every 6 (six) hours as needed.   B Complex Vitamins (B COMPLEX PO) Take by mouth every other day. Alternates with MVI   CALCIUM MAGNESIUM ZINC PO Take by mouth every other day. Alternates with MVI   cetirizine (ZYRTEC) 10 MG chewable tablet Chew 10 mg by mouth daily.   levothyroxine  (SYNTHROID ) 75 MCG tablet TAKE 1 TABLET(75 MCG) BY MOUTH DAILY   losartan  (COZAAR ) 25 MG tablet TAKE 1 TABLET(25 MG) BY MOUTH DAILY   Multiple Vitamin (MULTIVITAMIN) tablet Take 1 tablet by mouth every other day. Alternates with B Complex and Calcium, magnesium, zinc   No current facility-administered medications on file prior to visit.    Allergies:  Allergies  Allergen Reactions   Meperidine Nausea And Vomiting   Blue Dyes (Parenteral) Dermatitis   Mucinex [Guaifenesin Er] Dermatitis   Nsaids     Physician told her not to use due to extended prior use.   Petroleum Distillate Dermatitis   Petroleum Ether Dermatitis   Hydrochlorothiazide Itching, Rash and Swelling   Lisinopril Rash   Other Rash    4-tert-butylphenol formaldehyde resin: allergic contact dermatitis Paraphenylenediamine: allergic contact dermatitis Disperse blue: allergic contact dermatitis Dispersed blue dye    Petrolatum Rash    !!!!!ALLERGIC TO ANY SYNTHETIC FIBERS WITH A RASH REACTION!!!! !!!!!ALLERGIC TO ANY SYNTHETIC FIBERS WITH A RASH REACTION!!!!    Tape Rash   White Petrolatum Rash    Social History:  Social History   Socioeconomic History   Marital status: Single    Spouse name: Not on file   Number of children: Not on file   Years  of education: Not on file   Highest education level: Bachelor's degree (e.g., BA, AB, BS)  Occupational History   Not on file  Tobacco Use   Smoking status: Never   Smokeless tobacco: Never  Vaping Use   Vaping status: Never Used  Substance and Sexual Activity   Alcohol use: Yes    Alcohol/week: 2.0 standard drinks of alcohol    Types: 2 Glasses of wine per week   Drug use: Never   Sexual activity: Not Currently    Partners: Male  Other Topics Concern   Not on file  Social History Narrative   Not on file   Social Drivers of Health   Financial Resource Strain: Low Risk  (07/10/2024)   Overall Financial Resource Strain (CARDIA)    Difficulty of Paying Living Expenses: Not very hard  Food Insecurity: No Food  Insecurity (07/10/2024)   Hunger Vital Sign    Worried About Running Out of Food in the Last Year: Never true    Ran Out of Food in the Last Year: Never true  Transportation Needs: No Transportation Needs (07/10/2024)   PRAPARE - Administrator, Civil Service (Medical): No    Lack of Transportation (Non-Medical): No  Physical Activity: Insufficiently Active (07/10/2024)   Exercise Vital Sign    Days of Exercise per Week: 4 days    Minutes of Exercise per Session: 30 min  Stress: No Stress Concern Present (07/10/2024)   [employer] of Occupational Health - Occupational Stress Questionnaire    Feeling of Stress: Only a little  Social Connections: Unknown (07/10/2024)   Social Connection and Isolation Panel    Frequency of Communication with Friends and Family: Three times a week    Frequency of Social Gatherings with Friends and Family: Twice a week    Attends Religious Services: Patient declined    Database administrator or Organizations: No    Attends Engineer, structural: Not on file    Marital Status: Patient declined  Intimate Partner Violence: Not At Risk (07/11/2024)   Humiliation, Afraid, Rape, and Kick questionnaire    Fear of Current or  Ex-Partner: No    Emotionally Abused: No    Physically Abused: No    Sexually Abused: No   Social History   Tobacco Use  Smoking Status Never  Smokeless Tobacco Never   Social History   Substance and Sexual Activity  Alcohol Use Yes   Alcohol/week: 2.0 standard drinks of alcohol   Types: 2 Glasses of wine per week    Family History:  Family History  Problem Relation Age of Onset   Hypertension Mother    Colon cancer Mother    Alzheimer's disease Mother    Diabetes Father    Hypertension Father    Arrhythmia Father    Cancer Brother    Schizophrenia Maternal Grandmother    Heart failure Maternal Grandfather    Breast cancer Neg Hx     Past medical history, surgical history, medications, allergies, family history and social history reviewed with patient today and changes made to appropriate areas of the chart.   Review of Systems  Constitutional:  Negative for malaise/fatigue and weight loss.  Eyes:  Negative for blurred vision and double vision.  Respiratory:  Negative for shortness of breath.   Cardiovascular:  Positive for leg swelling. Negative for chest pain and palpitations.  Gastrointestinal:  Negative for constipation and diarrhea.  Musculoskeletal:  Positive for myalgias.  Neurological:  Positive for headaches. Negative for dizziness.  Psychiatric/Behavioral:  Positive for depression. The patient is not nervous/anxious.    All other ROS negative except what is listed above and in the HPI.      Objective:    BP 129/80 (BP Location: Right Arm, Patient Position: Sitting, Cuff Size: Large)   Pulse 68   Temp 98.7 F (37.1 C) (Oral)   Resp 15   Ht 5' 2.99 (1.6 m)   Wt 219 lb 6.4 oz (99.5 kg)   SpO2 96%   BMI 38.87 kg/m   Wt Readings from Last 3 Encounters:  07/11/24 219 lb 6.4 oz (99.5 kg)  10/06/23 216 lb 6.4 oz (98.2 kg)  08/23/23 212 lb 1.3 oz (96.2 kg)    Physical Exam Vitals and nursing note reviewed.  Constitutional:      General: She is  awake. She  is not in acute distress.    Appearance: Normal appearance. She is well-developed. She is obese. She is not ill-appearing.  HENT:     Head: Normocephalic and atraumatic.     Right Ear: Hearing, tympanic membrane, ear canal and external ear normal. No drainage.     Left Ear: Hearing, tympanic membrane, ear canal and external ear normal. No drainage.     Nose: Nose normal.     Right Sinus: No maxillary sinus tenderness or frontal sinus tenderness.     Left Sinus: No maxillary sinus tenderness or frontal sinus tenderness.     Mouth/Throat:     Mouth: Mucous membranes are moist.     Pharynx: Oropharynx is clear. Uvula midline. No pharyngeal swelling, oropharyngeal exudate or posterior oropharyngeal erythema.  Eyes:     General: Lids are normal.        Right eye: No discharge.        Left eye: No discharge.     Extraocular Movements: Extraocular movements intact.     Conjunctiva/sclera: Conjunctivae normal.     Pupils: Pupils are equal, round, and reactive to light.     Visual Fields: Right eye visual fields normal and left eye visual fields normal.  Neck:     Thyroid : No thyromegaly.     Vascular: No carotid bruit.     Trachea: Trachea normal.  Cardiovascular:     Rate and Rhythm: Normal rate and regular rhythm.     Heart sounds: Normal heart sounds. No murmur heard.    No gallop.  Pulmonary:     Effort: Pulmonary effort is normal. No accessory muscle usage or respiratory distress.     Breath sounds: Normal breath sounds.  Chest:  Breasts:    Right: Normal.     Left: Normal.  Abdominal:     General: Bowel sounds are normal.     Palpations: Abdomen is soft. There is no hepatomegaly or splenomegaly.     Tenderness: There is no abdominal tenderness.  Musculoskeletal:        General: Normal range of motion.     Cervical back: Normal range of motion and neck supple.     Right lower leg: No edema.     Left lower leg: No edema.  Lymphadenopathy:     Head:     Right side  of head: No submental, submandibular, tonsillar, preauricular or posterior auricular adenopathy.     Left side of head: No submental, submandibular, tonsillar, preauricular or posterior auricular adenopathy.     Cervical: No cervical adenopathy.     Upper Body:     Right upper body: No supraclavicular, axillary or pectoral adenopathy.     Left upper body: No supraclavicular, axillary or pectoral adenopathy.  Skin:    General: Skin is warm and dry.     Capillary Refill: Capillary refill takes less than 2 seconds.     Findings: No rash.  Neurological:     Mental Status: She is alert and oriented to person, place, and time.     Gait: Gait is intact.  Psychiatric:        Attention and Perception: Attention normal.        Mood and Affect: Mood normal.        Speech: Speech normal.        Behavior: Behavior normal. Behavior is cooperative.        Thought Content: Thought content normal.        Judgment: Judgment normal.  Results for orders placed or performed in visit on 07/27/23  Cytology - PAP   Collection Time: 07/27/23  3:50 PM  Result Value Ref Range   High risk HPV Negative    Adequacy      Satisfactory for evaluation; transformation zone component PRESENT.   Diagnosis (A)     - Atypical squamous cells of undetermined significance (ASC-US )   Comment Normal Reference Range HPV - Negative       Assessment & Plan:   Problem List Items Addressed This Visit       Endocrine   Hypothyroidism, acquired   Rechecking labs today. Await results. Treat as needed.       Relevant Orders   TSH   T4, free     Other   Bipolar disorder (HCC)   Chronic. Not well controlled.  Feels like it is related more to her worsening pain. Will work to get pain under control. Follow up in 3 months.  Call sooner if concerns arise.       Prediabetes   Labs ordered at visit today.  Will make recommendations based on lab results.        Relevant Orders   Hemoglobin A1c   Obesity (BMI  30-39.9)   Recommended eating smaller high protein, low fat meals more frequently and exercising 30 mins a day 5 times a week with a goal of 10-15lb weight loss in the next 3 months.       Fibromyalgia   Chronic. Not well controlled.  Gabapentin  provided no relief in the past. Will start Pregabalin  25mg  BID.  Side effects and benefits of medication discussed.  Follow up in 3 months.  Call sooner if concerns aries.       Relevant Medications   tiZANidine  (ZANAFLEX ) 4 MG tablet   pregabalin  (LYRICA ) 25 MG capsule   Advanced care planning/counseling discussion   A voluntary discussion about advance care planning including the explanation and discussion of advance directives was extensively discussed  with the patient for 5 minutes with patient.  Explanation about the health care proxy and Living will was reviewed and packet with forms with explanation of how to fill them out was given.   Patient was offered a separate Advance Care Planning visit for further assistance with forms.         Hypercholesteremia   Chronic.  Controlled.  Continue with current medication regimen.  Labs ordered today.  Return to clinic in 6 months for reevaluation.  Call sooner if concerns arise.        Other Visit Diagnoses       Encounter for Medicare annual wellness exam    -  Primary     Annual physical exam       Health maintenance reviewed during visit today.  Labs ordered.  Vaccines reviewed.  Colonoscopy up to date. Mammogram and Bone Density ordered.   Relevant Orders   CBC with Differential/Platelet   Comprehensive metabolic panel with GFR   Lipid panel   TSH   Hemoglobin A1c     Encounter for screening mammogram for malignant neoplasm of breast       Relevant Orders   MM 3D SCREENING MAMMOGRAM BILATERAL BREAST     Post-menopausal       Relevant Orders   DG Bone Density        Follow up plan: Return in about 3 months (around 10/11/2024) for Fibromyalgia pain.   LABORATORY TESTING:  - Pap  smear: not applicable  IMMUNIZATIONS:   - Tdap: Tetanus vaccination status reviewed: last tetanus booster within 10 years. - Influenza: Postponed to flu season - Pneumovax: Up to date - Prevnar: Up to date - COVID: Not applicable - HPV: Not applicable - Shingrix vaccine: Up to date  SCREENING: -Mammogram: Ordered today  - Colonoscopy: Up to date  - Bone Density: Ordered today  -Hearing Test: Up to date  -Spirometry: Not applicable   PATIENT COUNSELING:   Advised to take 1 mg of folate supplement per day if capable of pregnancy.   Sexuality: Discussed sexually transmitted diseases, partner selection, use of condoms, avoidance of unintended pregnancy  and contraceptive alternatives.   Advised to avoid cigarette smoking.  I discussed with the patient that most people either abstain from alcohol or drink within safe limits (<=14/week and <=4 drinks/occasion for males, <=7/weeks and <= 3 drinks/occasion for females) and that the risk for alcohol disorders and other health effects rises proportionally with the number of drinks per week and how often a drinker exceeds daily limits.  Discussed cessation/primary prevention of drug use and availability of treatment for abuse.   Diet: Encouraged to adjust caloric intake to maintain  or achieve ideal body weight, to reduce intake of dietary saturated fat and total fat, to limit sodium intake by avoiding high sodium foods and not adding table salt, and to maintain adequate dietary potassium and calcium preferably from fresh fruits, vegetables, and low-fat dairy products.    stressed the importance of regular exercise  Injury prevention: Discussed safety belts, safety helmets, smoke detector, smoking near bedding or upholstery.   Dental health: Discussed importance of regular tooth brushing, flossing, and dental visits.    NEXT PREVENTATIVE PHYSICAL DUE IN 1 YEAR. Return in about 3 months (around 10/11/2024) for Fibromyalgia  pain.

## 2024-07-11 NOTE — Assessment & Plan Note (Signed)
 Labs ordered at visit today.  Will make recommendations based on lab results.

## 2024-07-11 NOTE — Progress Notes (Signed)
 Subjective:   Shelby Johns is a 68 y.o. female who presents for Medicare Annual (Subsequent) preventive examination.  Visit Complete: In person  Patient Medicare AWV questionnaire was completed by the patient on 07/11/24; I have confirmed that all information answered by patient is correct and no changes since this date.  Cardiac Risk Factors include: advanced age (>59men, >68 women);family history of premature cardiovascular disease;hypertension;obesity (BMI >30kg/m2)     Objective:    Today's Vitals   07/11/24 1314 07/11/24 1339  BP: 129/80   Pulse: 68   Resp: 15   Temp: 98.7 F (37.1 C)   TempSrc: Oral   SpO2: 96%   Weight: 219 lb 6.4 oz (99.5 kg)   Height: 5' 2.99 (1.6 m)   PainSc: 6  6    Body mass index is 38.87 kg/m.     07/11/2024    1:44 PM 08/23/2023   10:13 AM  Advanced Directives  Does Patient Have a Medical Advance Directive? No No  Type of Special educational needs teacher of [place];Living will  Does patient want to make changes to medical advance directive?  No - Patient declined  Copy of Healthcare Power of Attorney in Chart?  No - copy requested  Would patient like information on creating a medical advance directive? Yes (MAU/Ambulatory/Procedural Areas - Information given) Yes (MAU/Ambulatory/Procedural Areas - Information given)    Current Medications (verified) Outpatient Encounter Medications as of 07/11/2024  Medication Sig   acetaminophen  (TYLENOL ) 500 MG tablet Take 2 tablets (1,000 mg total) by mouth every 6 (six) hours as needed.   B Complex Vitamins (B COMPLEX PO) Take by mouth every other day. Alternates with MVI   CALCIUM MAGNESIUM ZINC PO Take by mouth every other day. Alternates with MVI   cetirizine (ZYRTEC) 10 MG chewable tablet Chew 10 mg by mouth daily.   levothyroxine  (SYNTHROID ) 75 MCG tablet TAKE 1 TABLET(75 MCG) BY MOUTH DAILY   losartan  (COZAAR ) 25 MG tablet TAKE 1 TABLET(25 MG) BY MOUTH DAILY   Multiple Vitamin  (MULTIVITAMIN) tablet Take 1 tablet by mouth every other day. Alternates with B Complex and Calcium, magnesium, zinc   pregabalin  (LYRICA ) 25 MG capsule Take 1 capsule (25 mg total) by mouth 2 (two) times daily.   [DISCONTINUED] tiZANidine  (ZANAFLEX ) 4 MG tablet TAKE 1 TABLET(4 MG) BY MOUTH EVERY 6 HOURS AS NEEDED FOR MUSCLE SPASMS   tiZANidine  (ZANAFLEX ) 4 MG tablet Take 1 tablet (4 mg total) by mouth every 8 (eight) hours as needed for muscle spasms.   [DISCONTINUED] lamoTRIgine  (LAMICTAL ) 100 MG tablet Take 1 tablet (100 mg total) by mouth daily.   [DISCONTINUED] montelukast  (SINGULAIR ) 10 MG tablet TAKE 1 TABLET(10 MG) BY MOUTH AT BEDTIME   No facility-administered encounter medications on file as of 07/11/2024.    Allergies (verified) Meperidine, Blue dyes (parenteral), Mucinex [guaifenesin er], Nsaids, Petroleum distillate, Petroleum ether, Hydrochlorothiazide, Lisinopril, Other, Petrolatum, Tape, and White petrolatum   History: Past Medical History:  Diagnosis Date   Allergies    Arthritis    Asthma    Bipolar 2 disorder (HCC)    Fibromyalgia    Hypertension    Hypothyroidism    Migraine headache    with weather changes   Obesity (BMI 30-39.9)    Prediabetes    Thyroid  disease    TMJ (dislocation of temporomandibular joint)    Past Surgical History:  Procedure Laterality Date   BACK SURGERY     BREAST BIOPSY Right 11/26/2020   us  bx,  vision marker, path pending   BUNIONECTOMY Left    COLONOSCOPY WITH PROPOFOL  N/A 08/23/2023   Procedure: COLONOSCOPY WITH PROPOFOL ;  Surgeon: Jinny Carmine, MD;  Location[facility];  Service: Endoscopy;  Laterality: N/A;   NOSE SURGERY     REPLACEMENT TOTAL KNEE Right    TONSILLECTOMY     age 23   WISDOM TOOTH EXTRACTION     Family History  Problem Relation Age of Onset   Hypertension Mother    Colon cancer Mother    Alzheimer's disease Mother    Diabetes Father    Hypertension Father    Arrhythmia Father    Cancer Brother     Schizophrenia Maternal Grandmother    Heart failure Maternal Grandfather    Breast cancer Neg Hx    Social History   Socioeconomic History   Marital status: Single    Spouse name: Not on file   Number of children: Not on file   Years of education: Not on file   Highest education level: Bachelor's degree (e.g., BA, AB, BS)  Occupational History   Not on file  Tobacco Use   Smoking status: Never   Smokeless tobacco: Never  Vaping Use   Vaping status: Never Used  Substance and Sexual Activity   Alcohol use: Yes    Alcohol/week: 2.0 standard drinks of alcohol    Types: 2 Glasses of wine per week   Drug use: Never   Sexual activity: Not Currently    Partners: Male  Other Topics Concern   Not on file  Social History Narrative   Not on file   Social Drivers of Health   Financial Resource Strain: Low Risk  (07/10/2024)   Overall Financial Resource Strain (CARDIA)    Difficulty of Paying Living Expenses: Not very hard  Food Insecurity: No Food Insecurity (07/10/2024)   Hunger Vital Sign    Worried About Running Out of Food in the Last Year: Never true    Ran Out of Food in the Last Year: Never true  Transportation Needs: No Transportation Needs (07/10/2024)   PRAPARE - Administrator, Civil Service (Medical): No    Lack of Transportation (Non-Medical): No  Physical Activity: Insufficiently Active (07/10/2024)   Exercise Vital Sign    Days of Exercise per Week: 4 days    Minutes of Exercise per Session: 30 min  Stress: No Stress Concern Present (07/10/2024)   [employer] of Occupational Health - Occupational Stress Questionnaire    Feeling of Stress: Only a little  Social Connections: Unknown (07/10/2024)   Social Connection and Isolation Panel    Frequency of Communication with Friends and Family: Three times a week    Frequency of Social Gatherings with Friends and Family: Twice a week    Attends Religious Services: Patient declined    Database administrator  or Organizations: No    Attends Engineer, structural: Not on file    Marital Status: Patient declined    Tobacco Counseling Counseling given: Not Answered   Clinical Intake:     Pain : 0-10 Pain Score: 6  Pain Type: Chronic pain Pain Orientation: Other (Comment) (all over) Pain Descriptors / Indicators: Aching Pain Onset: More than a month ago Pain Frequency: Constant Pain Relieving Factors: The weather Effect of Pain on Daily Activities: yes when it is a really bad day.  Pain Relieving Factors: The weather  BMI - recorded: 38.67 Nutritional Status: BMI > 30  Obese Nutritional Risks:  None Diabetes: No  How often do you need to have someone help you when you read instructions, pamphlets, or other written materials from your doctor or pharmacy?: 1 - Never  Interpreter Needed?: No      Activities of Daily Living    07/11/2024    1:40 PM 08/23/2023   10:01 AM  In your present state of health, do you have any difficulty performing the following activities:  Hearing? 0 0  Vision? 0 0  Difficulty concentrating or making decisions? 0 0  Walking or climbing stairs? 1 0  Comment Total knee and hip bothers her   Dressing or bathing? 0 0  Doing errands, shopping? 0   Preparing Food and eating ? N   Using the Toilet? N   In the past six months, have you accidently leaked urine? N   Do you have problems with loss of bowel control? N   Managing your Medications? N   Managing your Finances? N   Housekeeping or managing your Housekeeping? N     Patient Care Team: Melvin Pao, NP as PCP - General (Nurse Practitioner)  Indicate any recent Medical Services you may have received from other than [facility] providers in the past year (date may be approximate).     Assessment:   This is a routine wellness examination for Shelby Johns.  Hearing/Vision screen No results found.   Goals Addressed               This Visit's Progress     Become More Active (pt-stated)         Weight (lb) < 180 lb (81.6 kg) (pt-stated)   219 lb 6.4 oz (99.5 kg)      Depression Screen    07/11/2024    1:44 PM 07/27/2023    3:33 PM 06/21/2023    3:38 PM 10/21/2022    1:55 PM 08/19/2022    4:04 PM 04/01/2022    3:48 PM 01/12/2022    2:26 PM  PHQ 2/9 Scores  PHQ - 2 Score 0 2 2 1 1 2 1   PHQ- 9 Score  8 5 6 5 8 6     Fall Risk    07/11/2024    1:19 PM 07/27/2023    3:33 PM 06/21/2023    4:20 PM 10/21/2022    1:55 PM 08/19/2022    4:04 PM  Fall Risk   Falls in the past year? 0 0 0 0 0  Number falls in past yr: 0 0 0 0 0  Injury with Fall? 0 0 0 0 0  Risk for fall due to : No Fall Risks No Fall Risks  No Fall Risks No Fall Risks  Follow up Falls evaluation completed Falls evaluation completed Falls evaluation completed Falls evaluation completed  Falls evaluation completed      Data saved with a previous flowsheet row definition    MEDICARE RISK AT HOME: Medicare Risk at Home Any stairs in or around the home?: No If so, are there any without handrails?: No Home free of loose throw rugs in walkways, pet beds, electrical cords, etc?: No Adequate lighting in your home to reduce risk of falls?: No Life alert?: No Use of a cane, walker or w/c?: No Grab bars in the bathroom?: No Shower chair or bench in shower?: No Elevated toilet seat or a handicapped toilet?: No  TIMED UP AND GO:  Was the test performed?  Yes  Length of time to ambulate 10 feet: 10 sec Gait  slow and steady without use of assistive device    Cognitive Function:        07/11/2024    1:44 PM 06/21/2023    3:31 PM  6CIT Screen  What Year? 0 points 0 points  What month? 0 points 0 points  What time? 0 points 0 points  Count back from 20 0 points 0 points  Months in reverse 0 points 0 points  Repeat phrase 0 points 0 points  Total Score 0 points 0 points    Immunizations Immunization History  Administered Date(s) Administered   Fluad Quad(high Dose 65+) 10/21/2022   Fluad Trivalent(High  Dose 65+) 10/06/2023   Influenza,inj,Quad PF,6+ Mos 10/10/2020, 09/26/2021   Influenza,inj,quad, With Preservative 08/11/2017   Moderna Covid-19 Fall Seasonal Vaccine 22yrs & older 10/27/2022   PFIZER Comirnaty(Gray Top)Covid-19 Tri-Sucrose Vaccine 03/03/2020, 03/24/2020, 10/24/2020   PFIZER(Purple Top)SARS-COV-2 Vaccination 03/03/2020, 03/24/2020, 10/24/2020   PNEUMOCOCCAL CONJUGATE-20 10/06/2023   Pneumococcal Polysaccharide-23 07/15/2016   Td 07/08/2007   Tdap 07/06/2019, 04/01/2022   Zoster Recombinant(Shingrix) 11/11/2020, 10/27/2022   Zoster, Live 10/06/2013    TDAP status: Up to date  Flu Vaccine status: Up to date  Pneumococcal vaccine status: Up to date  Covid-19 vaccine status: Completed vaccines  Qualifies for Shingles Vaccine? No   Zostavax completed No   Shingrix Completed?: Yes  Screening Tests Health Maintenance  Topic Date Due   DEXA SCAN  Never done   MAMMOGRAM  10/10/2022   COVID-19 Vaccine (8 - 2024-25 season) 08/08/2023   Medicare Annual Wellness (AWV)  06/20/2024   INFLUENZA VACCINE  07/07/2024   Cervical Cancer Screening (HPV/Pap Cotest)  07/26/2026   DTaP/Tdap/Td (4 - Td or Tdap) 04/01/2032   Colonoscopy  08/22/2033   Pneumococcal Vaccine: 50+ Years  Completed   Hepatitis C Screening  Completed   Zoster Vaccines- Shingrix  Completed   Hepatitis B Vaccines  Aged Out   HPV VACCINES  Aged Out   Meningococcal B Vaccine  Aged Out    Health Maintenance  Health Maintenance Due  Topic Date Due   DEXA SCAN  Never done   MAMMOGRAM  10/10/2022   COVID-19 Vaccine (8 - 2024-25 season) 08/08/2023   Medicare Annual Wellness (AWV)  06/20/2024   INFLUENZA VACCINE  07/07/2024   Mammogram status: Ordered 07/11/2024. Pt provided with contact info and advised to call to schedule appt.   Bone Density status: Ordered 07/11/2024. Pt provided with contact info and advised to call to schedule appt.  Additional Screening:  Hepatitis C Screening: does qualify;  Completed 10/10/2020  Vision Screening: Recommended annual ophthalmology exams for early detection of glaucoma and other disorders of the eye. Is the patient up to date with their annual eye exam?  Yes   Dental Screening: Recommended annual dental exams for proper oral hygiene   Community Resource Referral / Chronic Care Management: CRR required this visit?  No   CCM required this visit?  No     Plan:     I have personally reviewed and noted the following in the patient's chart:   Medical and social history Use of alcohol, tobacco or illicit drugs  Current medications and supplements including opioid prescriptions. Patient is not currently taking opioid prescriptions. Functional ability and status Nutritional status Physical activity Advanced directives List of other physicians Hospitalizations, surgeries, and ER visits in previous 12 months Vitals Screenings to include cognitive, depression, and falls Referrals and appointments  In addition, I have reviewed and discussed with patient certain preventive protocols, quality  metrics, and best practice recommendations. A written personalized care plan for preventive services as well as general preventive health recommendations were provided to patient.     Comer HERO Shelby Johns, CMA   07/11/2024   After Visit Summary: (In Person-Printed) AVS printed and given to the patient

## 2024-07-11 NOTE — Patient Instructions (Signed)
 Please call to schedule your mammogram and/or bone density: [facility]  Address: [address], [place], [place] [address] Phone: [phone]  [facility] [address] [place],  [place]  [address] Phone: [phone]

## 2024-07-11 NOTE — Assessment & Plan Note (Signed)
 Rechecking labs today. Await results. Treat as needed.

## 2024-07-11 NOTE — Assessment & Plan Note (Signed)
 Recommended eating smaller high protein, low fat meals more frequently and exercising 30 mins a day 5 times a week with a goal of 10-15lb weight loss in the next 3 months.

## 2024-07-11 NOTE — Assessment & Plan Note (Signed)
 Chronic.  Controlled.  Continue with current medication regimen.  Labs ordered today.  Return to clinic in 6 months for reevaluation.  Call sooner if concerns arise.  ? ?

## 2024-07-11 NOTE — Assessment & Plan Note (Signed)
 Chronic. Not well controlled.  Gabapentin  provided no relief in the past. Will start Pregabalin  25mg  BID.  Side effects and benefits of medication discussed.  Follow up in 3 months.  Call sooner if concerns aries.

## 2024-07-11 NOTE — Assessment & Plan Note (Signed)
A voluntary discussion about advance care planning including the explanation and discussion of advance directives was extensively discussed  with the patient for 5 minutes with patient.  Explanation about the health care proxy and Living will was reviewed and packet with forms with explanation of how to fill them out was given.   Patient was offered a separate Advance Care Planning visit for further assistance with forms.    

## 2024-07-12 ENCOUNTER — Ambulatory Visit: Payer: Self-pay | Admitting: Nurse Practitioner

## 2024-07-12 LAB — CBC WITH DIFFERENTIAL/PLATELET
Basophils Absolute: 0 x10E3/uL (ref 0.0–0.2)
Basos: 1 %
EOS (ABSOLUTE): 0.2 x10E3/uL (ref 0.0–0.4)
Eos: 3 %
Hematocrit: 37.1 % (ref 34.0–46.6)
Hemoglobin: 11.8 g/dL (ref 11.1–15.9)
Immature Grans (Abs): 0 x10E3/uL (ref 0.0–0.1)
Immature Granulocytes: 0 %
Lymphocytes Absolute: 2.1 x10E3/uL (ref 0.7–3.1)
Lymphs: 38 %
MCH: 28.7 pg (ref 26.6–33.0)
MCHC: 31.8 g/dL (ref 31.5–35.7)
MCV: 90 fL (ref 79–97)
Monocytes Absolute: 0.3 x10E3/uL (ref 0.1–0.9)
Monocytes: 5 %
Neutrophils Absolute: 2.9 x10E3/uL (ref 1.4–7.0)
Neutrophils: 53 %
Platelets: 253 x10E3/uL (ref 150–450)
RBC: 4.11 x10E6/uL (ref 3.77–5.28)
RDW: 13.3 % (ref 11.7–15.4)
WBC: 5.6 x10E3/uL (ref 3.4–10.8)

## 2024-07-12 LAB — HEMOGLOBIN A1C
Est. average glucose Bld gHb Est-mCnc: 108 mg/dL
Hgb A1c MFr Bld: 5.4 % (ref 4.8–5.6)

## 2024-07-12 LAB — COMPREHENSIVE METABOLIC PANEL WITH GFR
ALT: 17 IU/L (ref 0–32)
AST: 31 IU/L (ref 0–40)
Albumin: 4.4 g/dL (ref 3.9–4.9)
Alkaline Phosphatase: 92 IU/L (ref 44–121)
BUN/Creatinine Ratio: 15 (ref 12–28)
BUN: 14 mg/dL (ref 8–27)
Bilirubin Total: 0.4 mg/dL (ref 0.0–1.2)
CO2: 21 mmol/L (ref 20–29)
Calcium: 8.9 mg/dL (ref 8.7–10.3)
Chloride: 103 mmol/L (ref 96–106)
Creatinine, Ser: 0.96 mg/dL (ref 0.57–1.00)
Globulin, Total: 2.3 g/dL (ref 1.5–4.5)
Glucose: 95 mg/dL (ref 70–99)
Potassium: 4.4 mmol/L (ref 3.5–5.2)
Sodium: 139 mmol/L (ref 134–144)
Total Protein: 6.7 g/dL (ref 6.0–8.5)
eGFR: 65 mL/min/1.73 (ref >59)

## 2024-07-12 LAB — TSH: TSH: 4.39 u[IU]/mL (ref 0.450–4.500)

## 2024-07-12 LAB — LIPID PANEL
Chol/HDL Ratio: 2.9 ratio (ref 0.0–4.4)
Cholesterol, Total: 208 mg/dL — ABNORMAL HIGH (ref 100–199)
HDL: 71 mg/dL (ref >39)
LDL Chol Calc (NIH): 123 mg/dL — ABNORMAL HIGH (ref 0–99)
Triglycerides: 81 mg/dL (ref 0–149)
VLDL Cholesterol Cal: 14 mg/dL (ref 5–40)

## 2024-07-12 LAB — T4, FREE: Free T4: 0.96 ng/dL (ref 0.82–1.77)

## 2024-08-16 ENCOUNTER — Ambulatory Visit
Admission: RE | Admit: 2024-08-16 | Discharge: 2024-08-16 | Disposition: A | Discharge status: Home / Self Care | Source: Ambulatory Visit | Attending: Nurse Practitioner | Admitting: Nurse Practitioner

## 2024-08-16 ENCOUNTER — Ambulatory Visit
Admission: RE | Admit: 2024-08-16 | Discharge: 2024-08-16 | Disposition: A | Discharge status: Home / Self Care | Source: Ambulatory Visit | Attending: Nurse Practitioner

## 2024-08-16 DIAGNOSIS — Z780 Asymptomatic menopausal state: Secondary | ICD-10-CM | POA: Diagnosis present

## 2024-08-16 DIAGNOSIS — Z1231 Encounter for screening mammogram for malignant neoplasm of breast: Secondary | ICD-10-CM | POA: Insufficient documentation

## 2024-08-21 NOTE — Progress Notes (Signed)
 Contacted via MyChart   Normal mammogram, may repeat in one year:)

## 2024-08-29 ENCOUNTER — Ambulatory Visit

## 2024-09-20 ENCOUNTER — Other Ambulatory Visit: Payer: Self-pay | Admitting: Nurse Practitioner

## 2024-09-20 ENCOUNTER — Other Ambulatory Visit: Payer: Self-pay | Admitting: Internal Medicine

## 2024-09-20 DIAGNOSIS — R058 Other specified cough: Secondary | ICD-10-CM

## 2024-09-21 ENCOUNTER — Other Ambulatory Visit: Payer: Self-pay | Admitting: Nurse Practitioner

## 2024-09-22 NOTE — Telephone Encounter (Signed)
 Requested medication (s) are due for refill today: Yes  Requested medication (s) are on the active medication list: Yes  Last refill:  07/11/24  Future visit scheduled: Yes  Notes to clinic:  Unable to refill per protocol, cannot delegate.      Requested Prescriptions  Pending Prescriptions Disp Refills   tiZANidine  (ZANAFLEX ) 4 MG tablet [Pharmacy Med Name: TIZANIDINE  4MG  TABLETS] 30 tablet 0    Sig: TAKE 1 TABLET(4 MG) BY MOUTH EVERY 6 HOURS AS NEEDED FOR MUSCLE SPASMS     Not Delegated - Cardiovascular:  Alpha-2 Agonists - tizanidine  Failed - 09/22/2024 11:43 AM      Failed - This refill cannot be delegated      Passed - Valid encounter within last 6 months    Recent Outpatient Visits           2 months ago Encounter for Medicare annual wellness exam   [place] [facility] Melvin Pao, NP              Signed Prescriptions Disp Refills   losartan  (COZAAR ) 25 MG tablet 90 tablet 0    Sig: TAKE 1 TABLET(25 MG) BY MOUTH DAILY     Cardiovascular:  Angiotensin Receptor Blockers Passed - 09/22/2024 11:43 AM      Passed - Cr in normal range and within 180 days    Creatinine, Ser  Date Value Ref Range Status  07/11/2024 0.96 0.57 - 1.00 mg/dL Final         Passed - K in normal range and within 180 days    Potassium  Date Value Ref Range Status  07/11/2024 4.4 3.5 - 5.2 mmol/L Final         Passed - Patient is not pregnant      Passed - Last BP in normal range    BP Readings from Last 1 Encounters:  07/11/24 129/80         Passed - Valid encounter within last 6 months    Recent Outpatient Visits           2 months ago Encounter for [employer] annual wellness exam    [facility] Melvin Pao, NP

## 2024-09-22 NOTE — Telephone Encounter (Signed)
 Requested Prescriptions  Pending Prescriptions Disp Refills   levothyroxine  (SYNTHROID ) 75 MCG tablet [Pharmacy Med Name: LEVOTHYROXINE  0.075MG  ( ) TABS] 90 tablet 2    Sig: TAKE 1 TABLET(75 MCG) BY MOUTH DAILY     Endocrinology:  Hypothyroid Agents Passed - 09/22/2024  2:22 PM      Passed - TSH in normal range and within 360 days    TSH  Date Value Ref Range Status  07/11/2024 4.390 0.450 - 4.500 uIU/mL Final         Passed - Valid encounter within last 12 months    Recent Outpatient Visits           2 months ago Encounter for [employer] annual wellness exam   [place] [facility] Melvin Pao, NP

## 2024-09-22 NOTE — Telephone Encounter (Signed)
 Requested Prescriptions  Pending Prescriptions Disp Refills   losartan  (COZAAR ) 25 MG tablet [Pharmacy Med Name: LOSARTAN  25MG  TABLETS] 90 tablet 0    Sig: TAKE 1 TABLET(25 MG) BY MOUTH DAILY     Cardiovascular:  Angiotensin Receptor Blockers Passed - 09/22/2024 11:43 AM      Passed - Cr in normal range and within 180 days    Creatinine, Ser  Date Value Ref Range Status  07/11/2024 0.96 0.57 - 1.00 mg/dL Final         Passed - K in normal range and within 180 days    Potassium  Date Value Ref Range Status  07/11/2024 4.4 3.5 - 5.2 mmol/L Final         Passed - Patient is not pregnant      Passed - Last BP in normal range    BP Readings from Last 1 Encounters:  07/11/24 129/80         Passed - Valid encounter within last 6 months    Recent Outpatient Visits           2 months ago Encounter for [employer] annual wellness exam   [facility] Melvin Pao, NP               tiZANidine  (ZANAFLEX ) 4 MG tablet [Pharmacy Med Name: TIZANIDINE  4MG  TABLETS] 30 tablet 0    Sig: TAKE 1 TABLET(4 MG) BY MOUTH EVERY 6 HOURS AS NEEDED FOR MUSCLE SPASMS     Not Delegated - Cardiovascular:  Alpha-2 Agonists - tizanidine  Failed - 09/22/2024 11:43 AM      Failed - This refill cannot be delegated      Passed - Valid encounter within last 6 months    Recent Outpatient Visits           2 months ago Encounter for Medicare annual wellness exam   [place] [facility] Melvin Pao, NP

## 2024-10-16 ENCOUNTER — Ambulatory Visit: Admitting: Nurse Practitioner

## 2024-10-16 ENCOUNTER — Encounter: Payer: Self-pay | Admitting: Nurse Practitioner

## 2024-10-16 VITALS — BP 113/76 | HR 66 | Temp 98.3°F | Ht 63.0 in | Wt 219.6 lb

## 2024-10-16 DIAGNOSIS — M797 Fibromyalgia: Secondary | ICD-10-CM

## 2024-10-16 DIAGNOSIS — Z23 Encounter for immunization: Secondary | ICD-10-CM

## 2024-10-16 DIAGNOSIS — M25561 Pain in right knee: Secondary | ICD-10-CM

## 2024-10-16 DIAGNOSIS — G8929 Other chronic pain: Secondary | ICD-10-CM

## 2024-10-16 MED ORDER — PREGABALIN 50 MG PO CAPS: 50.0000 mg | ORAL_CAPSULE | Freq: Two times a day (BID) | ORAL | 0 refills | Status: AC

## 2024-10-16 NOTE — Assessment & Plan Note (Signed)
 Chronic. Improved.  Will increase Lyrica  to 50mg .  Follow up in 3 months.  Call sooner if concerns arise.

## 2024-10-16 NOTE — Progress Notes (Signed)
 BP 113/76 (BP Location: Right Arm, Cuff Size: Large)   Pulse 66   Temp 98.3 F (36.8 C) (Oral)   Ht 5' 3 (1.6 m)   Wt 219 lb 9.6 oz (99.6 kg)   SpO2 98%   BMI 38.90 kg/m    Subjective:    Patient ID: Shelby Johns, female    DOB: 1956-10-29, 68 y.o.   MRN: [id number]  HPI: Shelby Johns is a 68 y.o. female  Chief Complaint  Patient presents with   Fibromyalgia   BIPOLAR/FIBROMYALGIA Patient states she is not taking the Lamictal .  Patient stopped the medication in July.   She feels like her Fibromyalgia is getting worse and feels like that is worsening her mood. The pain is more prominent.  She has tried Gabapentin  in the past but no relief was seen in the medication.  Last visit, Lyrica  25mg  BID was started- patient reports and feels like it is working well for her.  She does feel like hte dose could be increased to help a little bit more.  States she is currently having a total muscle spasm in her neck and shoulder.  Her right arm is inflamed and her hips are still having pain.  Patient would like to see Orthopedics for her Right knee pain Mood status: improved Satisfied with current treatment?: yes Symptom severity: moderate  Duration of current treatment : chronic Side effects: no Medication compliance: good compliance Previous psychiatric medications: lamictal  Depressed mood: yes Anxious mood: yes Anhedonia: no Significant weight loss or gain: yes Insomnia: no  Fatigue: yes Feelings of worthlessness or guilt: yes Impaired concentration/indecisiveness: no Suicidal ideations: no Hopelessness: no Crying spells: yes  Relevant past medical, surgical, family and social history reviewed and updated as indicated. Interim medical history since our last visit reviewed. Allergies and medications reviewed and updated.  Review of Systems  Musculoskeletal:  Positive for arthralgias and myalgias.    Per HPI unless specifically indicated above     Objective:    BP 113/76  (BP Location: Right Arm, Cuff Size: Large)   Pulse 66   Temp 98.3 F (36.8 C) (Oral)   Ht 5' 3 (1.6 m)   Wt 219 lb 9.6 oz (99.6 kg)   SpO2 98%   BMI 38.90 kg/m   Wt Readings from Last 3 Encounters:  10/16/24 219 lb 9.6 oz (99.6 kg)  07/11/24 219 lb 6.4 oz (99.5 kg)  10/06/23 216 lb 6.4 oz (98.2 kg)    Physical Exam Vitals and nursing note reviewed.  Constitutional:      General: She is not in acute distress.    Appearance: Normal appearance. She is obese. She is not ill-appearing, toxic-appearing or diaphoretic.  HENT:     Head: Normocephalic.     Right Ear: External ear normal.     Left Ear: External ear normal.     Nose: Nose normal.     Mouth/Throat:     Mouth: Mucous membranes are moist.     Pharynx: Oropharynx is clear.  Eyes:     General:        Right eye: No discharge.        Left eye: No discharge.     Extraocular Movements: Extraocular movements intact.     Conjunctiva/sclera: Conjunctivae normal.     Pupils: Pupils are equal, round, and reactive to light.  Cardiovascular:     Rate and Rhythm: Normal rate and regular rhythm.     Heart sounds: No murmur heard. Pulmonary:  Effort: Pulmonary effort is normal. No respiratory distress.     Breath sounds: Normal breath sounds. No wheezing or rales.  Musculoskeletal:     Cervical back: Normal range of motion and neck supple.  Skin:    General: Skin is warm and dry.     Capillary Refill: Capillary refill takes less than 2 seconds.  Neurological:     General: No focal deficit present.     Mental Status: She is alert and oriented to person, place, and time. Mental status is at baseline.  Psychiatric:        Mood and Affect: Mood normal.        Behavior: Behavior normal.        Thought Content: Thought content normal.        Judgment: Judgment normal.     Results for orders placed or performed in visit on 07/11/24  CBC with Differential/Platelet   Collection Time: 07/11/24  1:49 PM  Result Value Ref Range    WBC 5.6 3.4 - 10.8 x10E3/uL   RBC 4.11 3.77 - 5.28 x10E6/uL   Hemoglobin 11.8 11.1 - 15.9 g/dL   Hematocrit 62.8 65.9 - 46.6 %   MCV 90 79 - 97 fL   MCH 28.7 26.6 - 33.0 pg   MCHC 31.8 31.5 - 35.7 g/dL   RDW 86.6 88.2 - 84.5 %   Platelets 253 150 - 450 x10E3/uL   Neutrophils 53 Not Estab. %   Lymphs 38 Not Estab. %   Monocytes 5 Not Estab. %   Eos 3 Not Estab. %   Basos 1 Not Estab. %   Neutrophils Absolute 2.9 1.4 - 7.0 x10E3/uL   Lymphocytes Absolute 2.1 0.7 - 3.1 x10E3/uL   Monocytes Absolute 0.3 0.1 - 0.9 x10E3/uL   EOS (ABSOLUTE) 0.2 0.0 - 0.4 x10E3/uL   Basophils Absolute 0.0 0.0 - 0.2 x10E3/uL   Immature Granulocytes 0 Not Estab. %   Immature Grans (Abs) 0.0 0.0 - 0.1 x10E3/uL  Comprehensive metabolic panel with GFR   Collection Time: 07/11/24  1:49 PM  Result Value Ref Range   Glucose 95 70 - 99 mg/dL   BUN 14 8 - 27 mg/dL   Creatinine, Ser 9.03 0.57 - 1.00 mg/dL   eGFR 65 >40 fO/fpw/8.26   BUN/Creatinine Ratio 15 12 - 28   Sodium 139 134 - 144 mmol/L   Potassium 4.4 3.5 - 5.2 mmol/L   Chloride 103 96 - 106 mmol/L   CO2 21 20 - 29 mmol/L   Calcium 8.9 8.7 - 10.3 mg/dL   Total Protein 6.7 6.0 - 8.5 g/dL   Albumin 4.4 3.9 - 4.9 g/dL   Globulin, Total 2.3 1.5 - 4.5 g/dL   Bilirubin Total 0.4 0.0 - 1.2 mg/dL   Alkaline Phosphatase 92 44 - 121 IU/L   AST 31 0 - 40 IU/L   ALT 17 0 - 32 IU/L  Lipid panel   Collection Time: 07/11/24  1:49 PM  Result Value Ref Range   Cholesterol, Total 208 (H) 100 - 199 mg/dL   Triglycerides 81 0 - 149 mg/dL   HDL 71 >60 mg/dL   VLDL Cholesterol Cal 14 5 - 40 mg/dL   LDL Chol Calc (NIH) 876 (H) 0 - 99 mg/dL   Chol/HDL Ratio 2.9 0.0 - 4.4 ratio  TSH   Collection Time: 07/11/24  1:49 PM  Result Value Ref Range   TSH 4.390 0.450 - 4.500 uIU/mL  Hemoglobin A1c   Collection Time: 07/11/24  1:49 PM  Result Value Ref Range   Hgb A1c MFr Bld 5.4 4.8 - 5.6 %   Est. average glucose Bld gHb Est-mCnc 108 mg/dL  T4, free   Collection  Time: 07/11/24  1:49 PM  Result Value Ref Range   Free T4 0.96 0.82 - 1.77 ng/dL      Assessment & Plan:   Problem List Items Addressed This Visit       Other   Fibromyalgia   Chronic. Improved.  Will increase Lyrica  to 50mg .  Follow up in 3 months.  Call sooner if concerns arise.       Relevant Medications   pregabalin  (LYRICA ) 50 MG capsule   Other Visit Diagnoses       Need for influenza vaccination    -  Primary   Relevant Orders   Flu vaccine HIGH DOSE PF(Fluzone Trivalent) (Completed)     Need for COVID-19 vaccine       Relevant Orders   Pfizer Comirnaty Covid -19 Vaccine 65yrs and older (Completed)     Chronic pain of right knee       Relevant Orders   Ambulatory referral to Orthopedic Surgery        Follow up plan: Return in about 3 months (around 01/16/2025) for HTN, HLD, DM2 FU.

## 2024-10-24 DIAGNOSIS — M25561 Pain in right knee: Secondary | ICD-10-CM | POA: Diagnosis not present

## 2024-10-27 ENCOUNTER — Other Ambulatory Visit: Payer: Self-pay | Admitting: Nurse Practitioner

## 2024-10-30 ENCOUNTER — Other Ambulatory Visit: Payer: Self-pay

## 2024-10-30 DIAGNOSIS — I70213 Atherosclerosis of native arteries of extremities with intermittent claudication, bilateral legs: Secondary | ICD-10-CM

## 2024-10-30 NOTE — Telephone Encounter (Signed)
 Too soon for refill.  Requested Prescriptions  Pending Prescriptions Disp Refills   losartan  (COZAAR ) 25 MG tablet [Pharmacy Med Name: LOSARTAN  25MG  TABLETS] 90 tablet 0    Sig: TAKE 1 TABLET(25 MG) BY MOUTH DAILY     Cardiovascular:  Angiotensin Receptor Blockers Passed - 10/30/2024 10:58 AM      Passed - Cr in normal range and within 180 days    Creatinine, Ser  Date Value Ref Range Status  07/11/2024 0.96 0.57 - 1.00 mg/dL Final         Passed - K in normal range and within 180 days    Potassium  Date Value Ref Range Status  07/11/2024 4.4 3.5 - 5.2 mmol/L Final         Passed - Patient is not pregnant      Passed - Last BP in normal range    BP Readings from Last 1 Encounters:  10/16/24 113/76         Passed - Valid encounter within last 6 months    Recent Outpatient Visits           2 weeks ago Need for influenza vaccination   [place] [facility] Melvin Pao, NP   3 months ago Encounter for [employer] annual wellness exam   [place] [facility] Melvin Pao, NP

## 2024-11-20 ENCOUNTER — Encounter: Payer: Self-pay | Admitting: Nurse Practitioner

## 2024-11-20 ENCOUNTER — Ambulatory Visit: Admitting: Nurse Practitioner

## 2024-11-20 ENCOUNTER — Ambulatory Visit: Payer: Self-pay | Admitting: Nurse Practitioner

## 2024-11-20 VITALS — BP 136/84 | HR 76 | Temp 97.9°F | Ht 62.99 in | Wt 219.4 lb

## 2024-11-20 DIAGNOSIS — R051 Acute cough: Secondary | ICD-10-CM

## 2024-11-20 LAB — POC COVID19/FLU A&B COMBO
Covid Antigen, POC: NEGATIVE
Influenza A Antigen, POC: NEGATIVE
Influenza B Antigen, POC: NEGATIVE

## 2024-11-20 MED ORDER — AMOXICILLIN-POT CLAVULANATE 875-125 MG PO TABS: 1.0000 | ORAL_TABLET | Freq: Two times a day (BID) | ORAL | 0 refills | Status: DC

## 2024-11-20 MED ORDER — BENZONATATE 200 MG PO CAPS: 200.0000 mg | ORAL_CAPSULE | Freq: Two times a day (BID) | ORAL | 0 refills | Status: AC | PRN

## 2024-11-20 NOTE — Progress Notes (Signed)
 BP 136/84 (BP Location: Right Arm, Patient Position: Sitting, Cuff Size: Large)   Pulse 76   Temp 97.9 F (36.6 C) (Oral)   Ht 5' 2.99 (1.6 m)   Wt 219 lb 6.4 oz (99.5 kg)   SpO2 97%   BMI 38.87 kg/m    Subjective:    Patient ID: Shelby Johns, female    DOB: [birth year], 68 y.o.   MRN: [id number]  HPI: Shelby Johns is a 68 y.o. female  Chief Complaint  Patient presents with   Cough   Fever   chest congestion    Patient stated it started Monday of last week and it progressed.   UPPER RESPIRATORY TRACT INFECTION Worst symptom: symptoms started last Monday Fever: no Cough: yes Shortness of breath: yes Wheezing: yes Chest pain: no Chest tightness: yes Chest congestion: yes Nasal congestion: yes Runny nose: yes Post nasal drip: yes Sneezing: no Sore throat: yes Swollen glands: no Sinus pressure: yes Headache: no Face pain: no Toothache: no Ear pain: yes bilateral Ear pressure: yes bilateral Eyes red/itching:no Eye drainage/crusting: no  Vomiting: no Rash: no Fatigue: yes Sick contacts: no Strep contacts: no  Context: worse Recurrent sinusitis: no Relief with OTC cold/cough medications: no  Treatments attempted: cold/sinus   Relevant past medical, surgical, family and social history reviewed and updated as indicated. Interim medical history since our last visit reviewed. Allergies and medications reviewed and updated.  Review of Systems  Constitutional:  Positive for fatigue. Negative for fever.  HENT:  Positive for congestion, ear pain, postnasal drip, rhinorrhea, sinus pressure, sneezing and sore throat. Negative for dental problem and sinus pain.   Respiratory:  Positive for cough, chest tightness, shortness of breath and wheezing.   Cardiovascular:  Negative for chest pain.  Gastrointestinal:  Negative for vomiting.  Skin:  Negative for rash.  Neurological:  Negative for headaches.    Per HPI unless specifically indicated above      Objective:    BP 136/84 (BP Location: Right Arm, Patient Position: Sitting, Cuff Size: Large)   Pulse 76   Temp 97.9 F (36.6 C) (Oral)   Ht 5' 2.99 (1.6 m)   Wt 219 lb 6.4 oz (99.5 kg)   SpO2 97%   BMI 38.87 kg/m   Wt Readings from Last 3 Encounters:  11/20/24 219 lb 6.4 oz (99.5 kg)  10/16/24 219 lb 9.6 oz (99.6 kg)  07/11/24 219 lb 6.4 oz (99.5 kg)    Physical Exam Vitals and nursing note reviewed.  Constitutional:      General: She is not in acute distress.    Appearance: Normal appearance. She is normal weight. She is ill-appearing. She is not toxic-appearing or diaphoretic.  HENT:     Head: Normocephalic.     Right Ear: External ear normal. A middle ear effusion is present.     Left Ear: External ear normal. A middle ear effusion is present.     Nose: Congestion and rhinorrhea present.     Right Sinus: No maxillary sinus tenderness or frontal sinus tenderness.     Left Sinus: No maxillary sinus tenderness or frontal sinus tenderness.     Mouth/Throat:     Mouth: Mucous membranes are moist.     Pharynx: Oropharynx is clear. Posterior oropharyngeal erythema present. No oropharyngeal exudate.  Eyes:     General:        Right eye: No discharge.        Left eye: No discharge.     Extraocular  Movements: Extraocular movements intact.     Conjunctiva/sclera: Conjunctivae normal.     Pupils: Pupils are equal, round, and reactive to light.  Cardiovascular:     Rate and Rhythm: Normal rate and regular rhythm.     Heart sounds: No murmur heard. Pulmonary:     Effort: Pulmonary effort is normal. No respiratory distress.     Breath sounds: Normal breath sounds. No wheezing or rales.  Musculoskeletal:     Cervical back: Normal range of motion and neck supple.  Skin:    General: Skin is warm and dry.     Capillary Refill: Capillary refill takes less than 2 seconds.  Neurological:     General: No focal deficit present.     Mental Status: She is alert and oriented to person,  place, and time. Mental status is at baseline.  Psychiatric:        Mood and Affect: Mood normal.        Behavior: Behavior normal.        Thought Content: Thought content normal.        Judgment: Judgment normal.     Results for orders placed or performed in visit on 07/11/24  CBC with Differential/Platelet   Collection Time: 07/11/24  1:49 PM  Result Value Ref Range   WBC 5.6 3.4 - 10.8 x10E3/uL   RBC 4.11 3.77 - 5.28 x10E6/uL   Hemoglobin 11.8 11.1 - 15.9 g/dL   Hematocrit 62.8 65.9 - 46.6 %   MCV 90 79 - 97 fL   MCH 28.7 26.6 - 33.0 pg   MCHC 31.8 31.5 - 35.7 g/dL   RDW 86.6 88.2 - 84.5 %   Platelets 253 150 - 450 x10E3/uL   Neutrophils 53 Not Estab. %   Lymphs 38 Not Estab. %   Monocytes 5 Not Estab. %   Eos 3 Not Estab. %   Basos 1 Not Estab. %   Neutrophils Absolute 2.9 1.4 - 7.0 x10E3/uL   Lymphocytes Absolute 2.1 0.7 - 3.1 x10E3/uL   Monocytes Absolute 0.3 0.1 - 0.9 x10E3/uL   EOS (ABSOLUTE) 0.2 0.0 - 0.4 x10E3/uL   Basophils Absolute 0.0 0.0 - 0.2 x10E3/uL   Immature Granulocytes 0 Not Estab. %   Immature Grans (Abs) 0.0 0.0 - 0.1 x10E3/uL  Comprehensive metabolic panel with GFR   Collection Time: 07/11/24  1:49 PM  Result Value Ref Range   Glucose 95 70 - 99 mg/dL   BUN 14 8 - 27 mg/dL   Creatinine, Ser 9.03 0.57 - 1.00 mg/dL   eGFR 65 >40 fO/fpw/8.26   BUN/Creatinine Ratio 15 12 - 28   Sodium 139 134 - 144 mmol/L   Potassium 4.4 3.5 - 5.2 mmol/L   Chloride 103 96 - 106 mmol/L   CO2 21 20 - 29 mmol/L   Calcium 8.9 8.7 - 10.3 mg/dL   Total Protein 6.7 6.0 - 8.5 g/dL   Albumin 4.4 3.9 - 4.9 g/dL   Globulin, Total 2.3 1.5 - 4.5 g/dL   Bilirubin Total 0.4 0.0 - 1.2 mg/dL   Alkaline Phosphatase 92 44 - 121 IU/L   AST 31 0 - 40 IU/L   ALT 17 0 - 32 IU/L  Lipid panel   Collection Time: 07/11/24  1:49 PM  Result Value Ref Range   Cholesterol, Total 208 (H) 100 - 199 mg/dL   Triglycerides 81 0 - 149 mg/dL   HDL 71 >60 mg/dL   VLDL Cholesterol Cal 14 5 -  40  mg/dL   LDL Chol Calc (NIH) 876 (H) 0 - 99 mg/dL   Chol/HDL Ratio 2.9 0.0 - 4.4 ratio  TSH   Collection Time: 07/11/24  1:49 PM  Result Value Ref Range   TSH 4.390 0.450 - 4.500 uIU/mL  Hemoglobin A1c   Collection Time: 07/11/24  1:49 PM  Result Value Ref Range   Hgb A1c MFr Bld 5.4 4.8 - 5.6 %   Est. average glucose Bld gHb Est-mCnc 108 mg/dL  T4, free   Collection Time: 07/11/24  1:49 PM  Result Value Ref Range   Free T4 0.96 0.82 - 1.77 ng/dL      Assessment & Plan:   Problem List Items Addressed This Visit   None Visit Diagnoses       Acute cough    -  Primary   Symptoms goig on over 1 week. Will treat with Augmentin . Complete course of medication. Okay to continue with OTC symptom management. FLU/COVID neg in office.   Relevant Orders   POC Covid19/Flu A&B Antigen        Follow up plan: No follow-ups on file.

## 2024-11-23 NOTE — Progress Notes (Deleted)
 Patient ID: Shelby Johns, female   DOB: 1956/02/20, 68 y.o.   MRN: [id number]  Reason for Consult: No chief complaint on file.   Referred by Melvin Pao, NP  Subjective:     HPI Shelby Johns is a 68 y.o. female ***  Past Medical History:  Diagnosis Date   Allergies    Arthritis    Asthma    Bipolar 2 disorder (HCC)    Fibromyalgia    Hypertension    Hypothyroidism    Migraine headache    with weather changes   Obesity (BMI 30-39.9)    Prediabetes    Thyroid  disease    TMJ (dislocation of temporomandibular joint)    Family History  Problem Relation Age of Onset   Hypertension Mother    Colon cancer Mother    Alzheimer's disease Mother    Diabetes Father    Hypertension Father    Arrhythmia Father    Cancer Brother    Schizophrenia Maternal Grandmother    Heart failure Maternal Grandfather    Breast cancer Neg Hx    Past Surgical History:  Procedure Laterality Date   BACK SURGERY     BREAST BIOPSY Right 11/26/2020   us  bx, vision marker,  BENIGN BREAST TISSUE WITH FIBROADENOMATOID CHANGE. - NEGATIVE FOR ATYPIA AND MALIGNANC   BUNIONECTOMY Left    COLONOSCOPY WITH PROPOFOL  N/A 08/23/2023   Procedure: COLONOSCOPY WITH PROPOFOL ;  Surgeon: Jinny Carmine, MD;  Location: [facility];  Service: Endoscopy;  Laterality: N/A;   NOSE SURGERY     REPLACEMENT TOTAL KNEE Right    TONSILLECTOMY     age 49   WISDOM TOOTH EXTRACTION      Short Social History:  Social History   Tobacco Use   Smoking status: Never   Smokeless tobacco: Never  Substance Use Topics   Alcohol use: Not Currently    Comment: socially, on occasion    Allergies[1]  Current Outpatient Medications  Medication Sig Dispense Refill   acetaminophen  (TYLENOL ) 500 MG tablet Take 2 tablets (1,000 mg total) by mouth every 6 (six) hours as needed. 30 tablet 0   amoxicillin -clavulanate (AUGMENTIN ) 875-125 MG tablet Take 1 tablet by mouth 2 (two) times daily. 20 tablet 0   B Complex  Vitamins (B COMPLEX PO) Take by mouth every other day. Alternates with MVI     benzonatate  (TESSALON ) 200 MG capsule Take 1 capsule (200 mg total) by mouth 2 (two) times daily as needed for cough. 20 capsule 0   CALCIUM MAGNESIUM ZINC PO Take by mouth every other day. Alternates with MVI     cetirizine (ZYRTEC) 10 MG chewable tablet Chew 10 mg by mouth daily.     levothyroxine  (SYNTHROID ) 75 MCG tablet TAKE 1 TABLET(75 MCG) BY MOUTH DAILY 90 tablet 2   losartan  (COZAAR ) 25 MG tablet TAKE 1 TABLET(25 MG) BY MOUTH DAILY 90 tablet 0   Multiple Vitamin (MULTIVITAMIN) tablet Take 1 tablet by mouth every other day. Alternates with B Complex and Calcium, magnesium, zinc     pregabalin  (LYRICA ) 50 MG capsule Take 1 capsule (50 mg total) by mouth 2 (two) times daily. 180 capsule 0   tiZANidine  (ZANAFLEX ) 4 MG tablet TAKE 1 TABLET(4 MG) BY MOUTH EVERY 6 HOURS AS NEEDED FOR MUSCLE SPASMS 30 tablet 0   No current facility-administered medications for this visit.    REVIEW OF SYSTEMS  All other systems were reviewed and are negative     Objective:  Objective  There were no vitals filed for this visit. There is no height or weight on file to calculate BMI.  Physical Exam General: no acute distress Cardiac: hemodynamically stable Abdomen: non-tender, no pulsatile mass*** Extremities: no edema, cyanosis or wounds*** Vascular:   Right: ***  Left: ***  Data: ABI ***     Assessment/Plan:   Shelby Johns is a 68 y.o. female with ***  Recommendations to optimize cardiovascular risk: Abstinence from all tobacco products. Blood glucose control with goal A1c < 7%. Blood pressure control with goal blood pressure < 140/90 mmHg. Lipid reduction therapy with goal LDL-C <55 mg/dL  Aspirin 81mg  PO QD.  Atorvastatin 40-80mg  PO QD (or other high intensity statin therapy).   Shelby GORMAN Serve MD [facility]     [1]  Allergies Allergen Reactions   Meperidine  Nausea And Vomiting   Blue Dyes (Parenteral) Dermatitis   Mucinex [Guaifenesin Er] Dermatitis   Nsaids     Physician told her not to use due to extended prior use.   Petroleum Distillate Dermatitis   Petroleum Ether Dermatitis   Hydrochlorothiazide Itching, Rash and Swelling   Lisinopril Rash   Other Rash    4-tert-butylphenol formaldehyde resin: allergic contact dermatitis Paraphenylenediamine: allergic contact dermatitis Disperse blue: allergic contact dermatitis Dispersed blue dye    Petrolatum Rash    !!!!!ALLERGIC TO ANY SYNTHETIC FIBERS WITH A RASH REACTION!!!! !!!!!ALLERGIC TO ANY SYNTHETIC FIBERS WITH A RASH REACTION!!!!    Tape Rash   White Petrolatum Rash

## 2024-11-24 ENCOUNTER — Ambulatory Visit (HOSPITAL_COMMUNITY)

## 2024-11-24 ENCOUNTER — Ambulatory Visit: Admitting: Vascular Surgery

## 2024-12-01 ENCOUNTER — Ambulatory Visit (HOSPITAL_COMMUNITY)
Admission: RE | Admit: 2024-12-01 | Discharge: 2024-12-01 | Disposition: A | Discharge status: Home / Self Care | Source: Ambulatory Visit | Attending: Vascular Surgery | Admitting: Vascular Surgery

## 2024-12-01 DIAGNOSIS — I70213 Atherosclerosis of native arteries of extremities with intermittent claudication, bilateral legs: Secondary | ICD-10-CM | POA: Insufficient documentation

## 2024-12-03 LAB — VAS US ABI WITH/WO TBI
Left ABI: 1.15
Right ABI: 1.13

## 2024-12-13 ENCOUNTER — Ambulatory Visit: Attending: Vascular Surgery | Admitting: Vascular Surgery

## 2024-12-13 ENCOUNTER — Encounter: Payer: Self-pay | Admitting: Vascular Surgery

## 2024-12-13 VITALS — BP 113/77 | HR 92 | Temp 97.8°F | Ht 62.0 in | Wt 218.0 lb

## 2024-12-13 DIAGNOSIS — M25561 Pain in right knee: Secondary | ICD-10-CM

## 2024-12-13 NOTE — Progress Notes (Signed)
 "  Patient ID: Shelby Johns, female   DOB: 03-12-1956, 69 y.o.   MRN: [id number]  Reason for Consult: New Patient (Initial Visit)   Referred by Melvin Pao, NP  Subjective:     HPI:  Shelby Johns is a 69 y.o. female history of right knee replacement 9 years ago in [place].  Around that time she had a total of 6 knee surgeries.  More recently she has developed swelling around the right knee.  She has no personal history of DVT although she does have a sister with a history of DVT.  She has never had vein intervention or arterial disease.  She does not wear compression stockings.  Swelling does not include her right lower leg or ankle.  She does have pain in the left knee without associated swelling.  She only walks with limitations of joint pain.  She works part-time at [employer] and [employer], [place]   Past Medical History:  Diagnosis Date   Allergies    Arthritis    Asthma    Bipolar 2 disorder (HCC)    Fibromyalgia    Hypertension    Hypothyroidism    Migraine headache    with weather changes   Obesity (BMI 30-39.9)    Prediabetes    Thyroid  disease    TMJ (dislocation of temporomandibular joint)    Family History  Problem Relation Age of Onset   Hypertension Mother    Colon cancer Mother    Alzheimer's disease Mother    Diabetes Father    Hypertension Father    Arrhythmia Father    Cancer Brother    Schizophrenia Maternal Grandmother    Heart failure Maternal Grandfather    Breast cancer Neg Hx    Past Surgical History:  Procedure Laterality Date   BACK SURGERY     BREAST BIOPSY Right 11/26/2020   us  bx, vision marker,  BENIGN BREAST TISSUE WITH FIBROADENOMATOID CHANGE. - NEGATIVE FOR ATYPIA AND MALIGNANC   BUNIONECTOMY Left    COLONOSCOPY WITH PROPOFOL  N/A 08/23/2023   Procedure: COLONOSCOPY WITH PROPOFOL ;  Surgeon: Jinny Carmine, MD;  Location: [facility];  Service: Endoscopy;  Laterality: N/A;   NOSE SURGERY     REPLACEMENT  TOTAL KNEE Right    TONSILLECTOMY     age 55   WISDOM TOOTH EXTRACTION      Short Social History:  Social History   Tobacco Use   Smoking status: Never   Smokeless tobacco: Never  Substance Use Topics   Alcohol use: Not Currently    Comment: socially, on occasion    Allergies[1]  Current Outpatient Medications  Medication Sig Dispense Refill   acetaminophen  (TYLENOL ) 500 MG tablet Take 2 tablets (1,000 mg total) by mouth every 6 (six) hours as needed. 30 tablet 0   B Complex Vitamins (B COMPLEX PO) Take by mouth every other day. Alternates with MVI     benzonatate  (TESSALON ) 200 MG capsule Take 1 capsule (200 mg total) by mouth 2 (two) times daily as needed for cough. 20 capsule 0   CALCIUM MAGNESIUM ZINC PO Take by mouth every other day. Alternates with MVI     cetirizine (ZYRTEC) 10 MG chewable tablet Chew 10 mg by mouth daily.     levothyroxine  (SYNTHROID ) 75 MCG tablet TAKE 1 TABLET(75 MCG) BY MOUTH DAILY 90 tablet 2   losartan  (COZAAR ) 25 MG tablet TAKE 1 TABLET(25 MG) BY MOUTH DAILY 90 tablet 0   Multiple Vitamin (MULTIVITAMIN) tablet Take 1 tablet by  mouth every other day. Alternates with B Complex and Calcium, magnesium, zinc     pregabalin  (LYRICA ) 50 MG capsule Take 1 capsule (50 mg total) by mouth 2 (two) times daily. 180 capsule 0   tiZANidine  (ZANAFLEX ) 4 MG tablet TAKE 1 TABLET(4 MG) BY MOUTH EVERY 6 HOURS AS NEEDED FOR MUSCLE SPASMS 30 tablet 0   No current facility-administered medications for this visit.    Review of Systems  Constitutional:  Constitutional negative. HENT: HENT negative.  Eyes: Eyes negative.  Respiratory: Respiratory negative.  Cardiovascular: Positive for leg swelling.  GI: Gastrointestinal negative.  Musculoskeletal: Positive for leg pain and joint pain.  Neurological: Neurological negative. Hematologic: Hematologic/lymphatic negative.  Psychiatric: Psychiatric negative.        Objective:  Objective   Vitals:   12/13/24 0948   BP: 113/77  Pulse: 92  Temp: 97.8 F (36.6 C)  SpO2: 96%  Weight: 218 lb (98.9 kg)  Height: 5' 2 (1.575 m)   Body mass index is 39.87 kg/m.  Physical Exam HENT:     Head: Normocephalic.     Mouth/Throat:     Mouth: Mucous membranes are moist.  Eyes:     Pupils: Pupils are equal, round, and reactive to light.  Cardiovascular:     Rate and Rhythm: Normal rate.     Pulses: Normal pulses.  Pulmonary:     Effort: Pulmonary effort is normal.  Abdominal:     General: Abdomen is flat.  Musculoskeletal:     Cervical back: Normal range of motion.     Right lower leg: No edema.     Left lower leg: No edema.     Comments: Well-healed right knee incision  Neurological:     General: No focal deficit present.     Mental Status: She is alert.  Psychiatric:        Mood and Affect: Mood normal.     Data: ABI Findings:  +---------+------------------+-----+---------+--------+  Right   Rt Pressure (mmHg)IndexWaveform Comment   +---------+------------------+-----+---------+--------+  Brachial 133                                       +---------+------------------+-----+---------+--------+  PTA     160               1.13 triphasic          +---------+------------------+-----+---------+--------+  DP      149               1.06 triphasic          +---------+------------------+-----+---------+--------+  Great Toe130               0.92                    +---------+------------------+-----+---------+--------+   +---------+------------------+-----+---------+-------+  Left    Lt Pressure (mmHg)IndexWaveform Comment  +---------+------------------+-----+---------+-------+  Brachial 141                                      +---------+------------------+-----+---------+-------+  PTA     162               1.15 triphasic         +---------+------------------+-----+---------+-------+  DP      149  1.06 triphasic          +---------+------------------+-----+---------+-------+  Great Toe130               0.92                   +---------+------------------+-----+---------+-------+   +-------+-----------+-----------+------------+------------+  ABI/TBIToday's ABIToday's TBIPrevious ABIPrevious TBI  +-------+-----------+-----------+------------+------------+  Right 1.13       0.92                                 +-------+-----------+-----------+------------+------------+  Left  1.15       0.92                                 +-------+-----------+-----------+------------+------------+           Summary:  Right: Resting right ankle-brachial index is within normal range. The  right toe-brachial index is normal.    Left: Resting left ankle-brachial index is within normal range. The left  toe-brachial index is normal.      Assessment/Plan:     69 year old female with history as above.  She does not appear to have any arterial or venous insufficiency with isolated swelling of her right knee for which I recommended gentle compression and she can see me on an as needed basis.     Penne Lonni Colorado MD [facility]      [1]  Allergies Allergen Reactions   Meperidine Nausea And Vomiting   Blue Dyes (Parenteral) Dermatitis   Mucinex [Guaifenesin Er] Dermatitis   Nsaids     Physician told her not to use due to extended prior use.   Petroleum Distillate Dermatitis   Petroleum Ether Dermatitis   Hydrochlorothiazide Itching, Rash and Swelling   Lisinopril Rash   Other Rash    4-tert-butylphenol formaldehyde resin: allergic contact dermatitis Paraphenylenediamine: allergic contact dermatitis Disperse blue: allergic contact dermatitis Dispersed blue dye    Petrolatum Rash    !!!!!ALLERGIC TO ANY SYNTHETIC FIBERS WITH A RASH REACTION!!!! !!!!!ALLERGIC TO ANY SYNTHETIC FIBERS WITH A RASH REACTION!!!!    Tape Rash   White Petrolatum  Rash   "

## 2025-01-01 ENCOUNTER — Encounter: Payer: Self-pay | Admitting: Nurse Practitioner

## 2025-01-02 MED ORDER — LOSARTAN POTASSIUM 25 MG PO TABS: ORAL_TABLET | ORAL | 1 refills | Status: AC

## 2025-01-16 ENCOUNTER — Ambulatory Visit: Admitting: Nurse Practitioner

## 2025-07-17 ENCOUNTER — Ambulatory Visit
